# Patient Record
Sex: Female | Born: 1935 | Race: White | Hispanic: No | Smoking: Never smoker
Health system: Southern US, Community
[De-identification: ages and names within clinical notes are randomized; demographics above are authoritative.]

## PROBLEM LIST (undated history)

## (undated) DIAGNOSIS — E7800 Pure hypercholesterolemia, unspecified: Secondary | ICD-10-CM

## (undated) DIAGNOSIS — G4700 Insomnia, unspecified: Secondary | ICD-10-CM

## (undated) DIAGNOSIS — I10 Essential (primary) hypertension: Secondary | ICD-10-CM

## (undated) DIAGNOSIS — D0510 Intraductal carcinoma in situ of unspecified breast: Secondary | ICD-10-CM

## (undated) DIAGNOSIS — C4492 Squamous cell carcinoma of skin, unspecified: Secondary | ICD-10-CM

## (undated) HISTORY — PX: EYE SURGERY: SHX253

## (undated) HISTORY — DX: Hypercalcemia: E83.52

## (undated) HISTORY — PX: ABDOMINAL HYSTERECTOMY: SHX81

## (undated) HISTORY — DX: Squamous cell carcinoma of skin, unspecified: C44.92

---

## 2000-10-18 ENCOUNTER — Encounter (INDEPENDENT_AMBULATORY_CARE_PROVIDER_SITE_OTHER): Payer: Self-pay | Admitting: Specialist

## 2000-10-18 ENCOUNTER — Ambulatory Visit (HOSPITAL_COMMUNITY): Admission: RE | Admit: 2000-10-18 | Discharge: 2000-10-18 | Payer: Self-pay | Admitting: Gastroenterology

## 2005-10-07 ENCOUNTER — Ambulatory Visit: Payer: Self-pay | Admitting: Family Medicine

## 2006-01-29 ENCOUNTER — Ambulatory Visit: Payer: Self-pay | Admitting: Family Medicine

## 2006-02-08 ENCOUNTER — Ambulatory Visit (HOSPITAL_COMMUNITY): Admission: RE | Admit: 2006-02-08 | Discharge: 2006-02-08 | Payer: Self-pay | Admitting: Family Medicine

## 2006-02-09 ENCOUNTER — Encounter: Admission: RE | Admit: 2006-02-09 | Discharge: 2006-02-09 | Payer: Self-pay | Admitting: Sports Medicine

## 2006-02-12 ENCOUNTER — Ambulatory Visit: Payer: Self-pay | Admitting: Family Medicine

## 2006-02-15 ENCOUNTER — Other Ambulatory Visit: Admission: RE | Admit: 2006-02-15 | Discharge: 2006-02-15 | Payer: Self-pay | Admitting: Interventional Radiology

## 2006-02-15 ENCOUNTER — Encounter: Admission: RE | Admit: 2006-02-15 | Discharge: 2006-02-15 | Payer: Self-pay | Admitting: Family Medicine

## 2006-02-15 ENCOUNTER — Encounter (INDEPENDENT_AMBULATORY_CARE_PROVIDER_SITE_OTHER): Payer: Self-pay | Admitting: Specialist

## 2006-06-17 DIAGNOSIS — I10 Essential (primary) hypertension: Secondary | ICD-10-CM | POA: Insufficient documentation

## 2006-06-17 DIAGNOSIS — E785 Hyperlipidemia, unspecified: Secondary | ICD-10-CM | POA: Insufficient documentation

## 2006-06-17 DIAGNOSIS — G4700 Insomnia, unspecified: Secondary | ICD-10-CM | POA: Insufficient documentation

## 2006-09-01 ENCOUNTER — Encounter: Admission: RE | Admit: 2006-09-01 | Discharge: 2006-09-01 | Payer: Self-pay | Admitting: Sports Medicine

## 2007-03-16 ENCOUNTER — Encounter: Admission: RE | Admit: 2007-03-16 | Discharge: 2007-03-16 | Payer: Self-pay | Admitting: Sports Medicine

## 2007-09-27 ENCOUNTER — Encounter: Admission: RE | Admit: 2007-09-27 | Discharge: 2007-09-27 | Payer: Self-pay | Admitting: Sports Medicine

## 2007-09-27 ENCOUNTER — Encounter: Payer: Self-pay | Admitting: Family Medicine

## 2008-11-26 ENCOUNTER — Encounter: Admission: RE | Admit: 2008-11-26 | Discharge: 2008-11-26 | Payer: Self-pay | Admitting: *Deleted

## 2010-01-27 ENCOUNTER — Encounter: Admission: RE | Admit: 2010-01-27 | Discharge: 2010-01-27 | Payer: Self-pay | Admitting: *Deleted

## 2010-09-05 NOTE — Procedures (Signed)
[place]  Patient:    Patricia Mclaughlin, Patricia Mclaughlin                         MRN: [id number] Proc. Date: 10/18/00 Adm. Date:  [id number] Attending:  Charna Elizabeth CC:         Huey Bienenstock McDiarmid, M.D.   Procedure Report  DATE OF BIRTH:  May 20, 1925  REFERRING PHYSICIAN:  Huey Bienenstock McDiarmid, M.D.  PROCEDURE PERFORMED:  Colonoscopy with snare polypectomy x 2 and cold biopsy x 1.  ENDOSCOPIST:  Anselmo Rod, M.D.  INSTRUMENT USED:  Olympus video colonoscope (pediatric).  INDICATIONS FOR PROCEDURE:  Screening colonoscopy being performed in a 77 year old white female who is otherwise healthy, rule out colonic polyps, masses, hemorrhoids.  PREPROCEDURE PREPARATION:  Informed consent was procured from the patient. The patient was fasted for eight hours prior to the procedure and prepped with a bottle of magnesium citrate and a gallon of NuLytely the night prior to the procedure.  PREPROCEDURE PHYSICAL:  The patient had stable vital signs.  Neck supple. Chest clear to auscultation.  S1, S2 regular.  Abdomen soft with normal abdominal bowel sounds.  DESCRIPTION OF PROCEDURE:  The patient was placed in the left lateral decubitus position and sedated with 50 mg of Demerol and 7.5 mg of Versed intravenously.  Once the patient was adequately sedated and maintained on low-flow oxygen and continuous cardiac monitoring, the Olympus video colonoscope was advanced from the rectum to the cecum without difficulty.  The patient had an excellent prep.  One small polyp was biopsied from the right colon with a core biopsy forceps.  Two smaller polyps were removed by snare polypectomy.  These were very small sessile polyps.  There was no evidence of diverticulosis, erosions, ulcerations, masses or hemorrhoids.  IMPRESSION:  Three small polyps removed from the right colon, otherwise healthy-appearing cecum, transverse colon, left colon, rectosigmoid  and rectum.  RECOMMENDATIONS: 1. Await pathology results. 2. Outpatient follow-up on a p.r.n. basis. 3. Repeat colorectal cancer screening depending on the pathology results.DD: 10/18/00 TD:  10/19/00 Job: [id number]

## 2010-12-31 ENCOUNTER — Other Ambulatory Visit: Payer: Self-pay | Admitting: *Deleted

## 2010-12-31 DIAGNOSIS — Z1231 Encounter for screening mammogram for malignant neoplasm of breast: Secondary | ICD-10-CM

## 2011-02-03 ENCOUNTER — Ambulatory Visit
Admission: RE | Admit: 2011-02-03 | Discharge: 2011-02-03 | Disposition: A | Discharge status: Home / Self Care | Payer: Medicare Other | Source: Ambulatory Visit | Attending: *Deleted | Admitting: *Deleted

## 2011-02-03 DIAGNOSIS — Z1231 Encounter for screening mammogram for malignant neoplasm of breast: Secondary | ICD-10-CM

## 2012-02-08 ENCOUNTER — Other Ambulatory Visit: Payer: Self-pay | Admitting: Oncology

## 2012-02-08 DIAGNOSIS — Z1231 Encounter for screening mammogram for malignant neoplasm of breast: Secondary | ICD-10-CM

## 2019-08-30 ENCOUNTER — Other Ambulatory Visit: Payer: Self-pay | Admitting: Neuromusculoskeletal Medicine & OMM

## 2019-08-30 DIAGNOSIS — Z1231 Encounter for screening mammogram for malignant neoplasm of breast: Secondary | ICD-10-CM

## 2019-09-04 ENCOUNTER — Other Ambulatory Visit: Payer: Self-pay | Admitting: Neuromusculoskeletal Medicine & OMM

## 2019-09-04 DIAGNOSIS — R921 Mammographic calcification found on diagnostic imaging of breast: Secondary | ICD-10-CM

## 2019-09-12 ENCOUNTER — Other Ambulatory Visit: Payer: Self-pay | Admitting: Neuromusculoskeletal Medicine & OMM

## 2019-09-12 ENCOUNTER — Other Ambulatory Visit: Payer: Self-pay

## 2019-09-12 ENCOUNTER — Ambulatory Visit
Admission: RE | Admit: 2019-09-12 | Discharge: 2019-09-12 | Disposition: A | Discharge status: Home / Self Care | Payer: Medicare Other | Source: Ambulatory Visit | Attending: Neuromusculoskeletal Medicine & OMM | Admitting: Neuromusculoskeletal Medicine & OMM

## 2019-09-12 DIAGNOSIS — R921 Mammographic calcification found on diagnostic imaging of breast: Secondary | ICD-10-CM

## 2019-09-12 MED FILL — AMOXICILLIN 500 MG CAPSULE: 500 | 7 days supply | Qty: 21 | Fill #0

## 2019-12-19 ENCOUNTER — Ambulatory Visit: Payer: Self-pay

## 2020-01-16 ENCOUNTER — Ambulatory Visit: Payer: Medicare Other | Attending: Internal Medicine

## 2020-01-16 DIAGNOSIS — Z23 Encounter for immunization: Secondary | ICD-10-CM

## 2020-01-16 NOTE — Progress Notes (Signed)
   Covid-19 Vaccination Clinic  Name:  Patricia Mclaughlin    MRN: [id number] DOB: [birth year]  01/16/2020  Patricia Mclaughlin was observed post Covid-19 immunization for 15 minutes without incident. She was provided with Vaccine Information Sheet and instruction to access the V-Safe system.   Patricia Mclaughlin was instructed to call 911 with any severe reactions post vaccine: Marland Kitchen Difficulty breathing  . Swelling of face and throat  . A fast heartbeat  . A bad rash all over body  . Dizziness and weakness

## 2020-03-18 ENCOUNTER — Ambulatory Visit
Admission: RE | Admit: 2020-03-18 | Discharge: 2020-03-18 | Disposition: A | Discharge status: Home / Self Care | Payer: Medicare Other | Source: Ambulatory Visit | Attending: Neuromusculoskeletal Medicine & OMM | Admitting: Neuromusculoskeletal Medicine & OMM

## 2020-03-18 ENCOUNTER — Other Ambulatory Visit: Payer: Self-pay | Admitting: Neuromusculoskeletal Medicine & OMM

## 2020-03-18 ENCOUNTER — Other Ambulatory Visit: Payer: Self-pay

## 2020-03-18 DIAGNOSIS — R921 Mammographic calcification found on diagnostic imaging of breast: Secondary | ICD-10-CM

## 2020-09-13 ENCOUNTER — Ambulatory Visit
Admission: RE | Admit: 2020-09-13 | Discharge: 2020-09-13 | Disposition: A | Discharge status: Home / Self Care | Payer: Medicare Other | Source: Ambulatory Visit | Attending: Neuromusculoskeletal Medicine & OMM | Admitting: Neuromusculoskeletal Medicine & OMM

## 2020-09-13 ENCOUNTER — Other Ambulatory Visit: Payer: Self-pay

## 2020-09-13 DIAGNOSIS — R921 Mammographic calcification found on diagnostic imaging of breast: Secondary | ICD-10-CM

## 2021-08-14 ENCOUNTER — Other Ambulatory Visit: Payer: Self-pay | Admitting: Internal Medicine

## 2021-08-14 DIAGNOSIS — R921 Mammographic calcification found on diagnostic imaging of breast: Secondary | ICD-10-CM

## 2021-09-21 ENCOUNTER — Ambulatory Visit (HOSPITAL_COMMUNITY)
Admission: RE | Admit: 2021-09-21 | Discharge: 2021-09-21 | Disposition: A | Discharge status: Home / Self Care | Payer: Medicare Other | Source: Ambulatory Visit

## 2021-09-21 ENCOUNTER — Ambulatory Visit (INDEPENDENT_AMBULATORY_CARE_PROVIDER_SITE_OTHER): Payer: Medicare Other

## 2021-09-21 ENCOUNTER — Encounter (HOSPITAL_COMMUNITY): Payer: Self-pay

## 2021-09-21 VITALS — BP 182/74 | HR 57 | Temp 97.9°F | Resp 20

## 2021-09-21 DIAGNOSIS — R059 Cough, unspecified: Secondary | ICD-10-CM | POA: Diagnosis not present

## 2021-09-21 DIAGNOSIS — J208 Acute bronchitis due to other specified organisms: Secondary | ICD-10-CM

## 2021-09-21 HISTORY — DX: Essential (primary) hypertension: I10

## 2021-09-21 HISTORY — DX: Pure hypercholesterolemia, unspecified: E78.00

## 2021-09-21 MED ORDER — PREDNISONE 20 MG PO TABS: 40.0000 mg | ORAL_TABLET | Freq: Every day | ORAL | 0 refills | Status: AC

## 2021-09-21 NOTE — ED Provider Notes (Signed)
[place]    CSN: [id number] Arrival date & time: 09/21/21  1542      History   Chief Complaint Chief Complaint  Patient presents with   Cough    Entered by patient    HPI Patricia Mclaughlin is a 88 y.o. female presenting with cough for approximately 10 days.  History noncontributory, denies history of pulmonary disease, never smoker.  She describes nonproductive cough for about 10 days.  This is not associated with dyspnea.  Denies sore throat, nasal congestion, facial pressure, ear pain, chest pain, new back or flank pain.  Has attempted Robitussin, Tessalon without relief.  Negative home COVID test.  HPI  Past Medical History:  Diagnosis Date   High cholesterol    Hypertension     Patient Active Problem List   Diagnosis Date Noted   HYPERLIPIDEMIA 06/17/2006   HYPERTENSION, BENIGN SYSTEMIC 06/17/2006   INSOMNIA NOS 06/17/2006    Past Surgical History:  Procedure Laterality Date   ABDOMINAL HYSTERECTOMY     EYE SURGERY      OB History   No obstetric history on file.      Home Medications    Prior to Admission medications   Medication Sig Start Date End Date Taking? Authorizing Provider  aspirin EC 81 MG tablet Take 81 mg by mouth daily. Swallow whole.   Yes [provider]  benzonatate (TESSALON PERLES) 100 MG capsule Take 100 mg by mouth in the morning, at noon, and at bedtime. 03/28/21  Yes [provider]  predniSONE (DELTASONE) 20 MG tablet Take 2 tablets (40 mg total) by mouth daily for 5 days. Take with breakfast or lunch. Avoid NSAIDs (ibuprofen, etc) while taking this medication. 09/21/21 09/26/21 Yes Hazel Sams, PA-C  amLODipine (NORVASC) 2.5 MG tablet Take 2.5 mg by mouth daily. 08/27/21   [provider]  atorvastatin (LIPITOR) 80 MG tablet Take 40 mg by mouth daily. 08/22/21   [provider]  bisoprolol (ZEBETA) 10 MG tablet Take 1 tablet by mouth 2 (two) times daily.    [provider]   Cholecalciferol 50 MCG (2000 UT) TABS Take 2,000 mg by mouth 1 day or 1 dose.    [provider]  Cyanocobalamin (VITAMIN B12) 1000 MCG TBCR Take 100 mcg by mouth daily.    [provider]  hydrochlorothiazide (HYDRODIURIL) 12.5 MG tablet Take 25 mg by mouth daily. 05/13/21   [provider]  magnesium oxide (MAG-OX) 400 MG tablet Take 1 tablet by mouth daily. 07/29/21   [provider]  potassium chloride (KLOR-CON) 8 MEQ tablet Take 8 mEq by mouth in the morning and at bedtime. 07/09/21   [provider]  zolpidem (AMBIEN) 5 MG tablet Take 2.5 mg by mouth at bedtime. 05/27/21   [provider]    Family History No family history on file.  Social History     Allergies   Ace inhibitors   Review of Systems Review of Systems  Constitutional:  Negative for appetite change, chills and fever.  HENT:  Negative for congestion, ear pain, rhinorrhea, sinus pressure, sinus pain and sore throat.   Eyes:  Negative for redness and visual disturbance.  Respiratory:  Positive for cough. Negative for chest tightness, shortness of breath and wheezing.   Cardiovascular:  Negative for chest pain and palpitations.  Gastrointestinal:  Negative for abdominal pain, constipation, diarrhea, nausea and vomiting.  Genitourinary:  Negative for dysuria, frequency and urgency.  Musculoskeletal:  Negative for myalgias.  Neurological:  Negative for dizziness, weakness and headaches.  Psychiatric/Behavioral:  Negative for confusion.   All other systems reviewed and are negative.   Physical Exam Triage Vital Signs ED Triage Vitals [09/21/21 1620]  Enc Vitals Group     BP (!) 182/74     Pulse Rate (!) 57     Resp 20     Temp 97.9 F (36.6 C)     Temp Source Oral     SpO2 94 %     Weight      Height      Head Circumference      Peak Flow      Pain Score 0     Pain Loc      Pain Edu?      Excl. in [place]?    No data found.  Updated Vital Signs BP (!)  182/74 (BP Location: Left Arm)   Pulse (!) 57   Temp 97.9 F (36.6 C) (Oral)   Resp 20   SpO2 94%   Visual Acuity Right Eye Distance:   Left Eye Distance:   Bilateral Distance:    Right Eye Near:   Left Eye Near:    Bilateral Near:     Physical Exam Vitals reviewed.  Constitutional:      General: She is not in acute distress.    Appearance: Normal appearance. She is not ill-appearing.  HENT:     Head: Normocephalic and atraumatic.     Right Ear: Tympanic membrane, ear canal and external ear normal. No tenderness. No middle ear effusion. There is no impacted cerumen. Tympanic membrane is not perforated, erythematous, retracted or bulging.     Left Ear: Tympanic membrane, ear canal and external ear normal. No tenderness.  No middle ear effusion. There is no impacted cerumen. Tympanic membrane is not perforated, erythematous, retracted or bulging.     Nose: Nose normal. No congestion.     Mouth/Throat:     Mouth: Mucous membranes are moist.     Pharynx: Uvula midline. No oropharyngeal exudate or posterior oropharyngeal erythema.  Eyes:     Extraocular Movements: Extraocular movements intact.     Pupils: Pupils are equal, round, and reactive to light.  Cardiovascular:     Rate and Rhythm: Normal rate and regular rhythm.     Heart sounds: Normal heart sounds.  Pulmonary:     Effort: Pulmonary effort is normal.     Breath sounds: Normal breath sounds. No decreased breath sounds, wheezing, rhonchi or rales.     Comments: Freq cough. Oxygenating comfortably.  Abdominal:     Palpations: Abdomen is soft.     Tenderness: There is no abdominal tenderness. There is no guarding or rebound.  Lymphadenopathy:     Cervical: No cervical adenopathy.     Right cervical: No superficial cervical adenopathy.    Left cervical: No superficial cervical adenopathy.  Neurological:     General: No focal deficit present.     Mental Status: She is alert and oriented to person, place, and time.   Psychiatric:        Mood and Affect: Mood normal.        Behavior: Behavior normal.        Thought Content: Thought content normal.        Judgment: Judgment normal.     UC Treatments / Results  Labs (all labs ordered are listed, but only abnormal results are displayed) Labs Reviewed - No data to display  EKG  Radiology DG Chest 2 View  Result Date: 09/21/2021 CLINICAL DATA:  Cough for >1 week EXAM: CHEST - 2 VIEW COMPARISON:  None Available. FINDINGS: The cardiomediastinal silhouette is normal in contour. Tortuous thoracic aorta. No pleural effusion. No pneumothorax. No acute pleuroparenchymal abnormality. Visualized abdomen is unremarkable. No acute osseous abnormality noted. IMPRESSION: No acute cardiopulmonary abnormality. Electronically Signed   By: Valentino Saxon M.D.   On: 09/21/2021 16:53    Procedures Procedures (including critical care time)  Medications Ordered in UC Medications - No data to display  Initial Impression / Assessment and Plan / UC Course  I have reviewed the triage vital signs and the nursing notes.  Pertinent labs & imaging results that were available during my care of the patient were reviewed by me and considered in my medical decision making (see chart for details).     This patient is a very pleasant 88 y.o. year old female presenting with viral bronchitis following viral syndrome. Today this pt is afebrile nontachycardic nontachypneic, oxygenating well on room air, no wheezes rhonchi or rales. Cough is nonproductive. No history pulm ds, never smoker.  CXR - No acute cardiopulmonary abnormality.  Low-dose prednisone sent. She is not a diabetic. Continue tessalon, robitussen.   ED return precautions discussed. Patient and daughters verbalize understanding and agreement.    Final Clinical Impressions(s) / UC Diagnoses   Final diagnoses:  Viral bronchitis     Discharge Instructions      -You have bronchitis. Bronchitis is an  inflammation of the lining of your bronchial tubes, which carry air to and from your lungs. This typically occurs after a virus, like a cold virus. People who have bronchitis often cough up thickened mucus, which can be discolored. This isn't a bacterial infection, so you don't need antibiotics. We treat it with medications to help reduce inflammation and open up your lungs.  -Prednisone, 2 pills taken at the same time for 5 days in a row.  Try taking this earlier in the day as it can give you energy. -Follow-up if symptoms worsen/persist      ED Prescriptions     Medication Sig Dispense Auth. Provider   predniSONE (DELTASONE) 20 MG tablet Take 2 tablets (40 mg total) by mouth daily for 5 days. Take with breakfast or lunch. Avoid NSAIDs (ibuprofen, etc) while taking this medication. 10 tablet Hazel Sams, PA-C      PDMP not reviewed this encounter.   Hazel Sams, PA-C 09/21/21 1725

## 2021-09-21 NOTE — ED Triage Notes (Signed)
Pt had cough for over week that is dry. Robitussin, allergy medications and Tessalon aren't helping.  Pt had home covid test today that was negative

## 2021-09-21 NOTE — Discharge Instructions (Addendum)
-  You have bronchitis. Bronchitis is an inflammation of the lining of your bronchial tubes, which carry air to and from your lungs. This typically occurs after a virus, like a cold virus. People who have bronchitis often cough up thickened mucus, which can be discolored. This isn't a bacterial infection, so you don't need antibiotics. We treat it with medications to help reduce inflammation and open up your lungs.  -Prednisone, 2 pills taken at the same time for 5 days in a row.  Try taking this earlier in the day as it can give you energy. -Follow-up if symptoms worsen/persist

## 2021-09-23 ENCOUNTER — Other Ambulatory Visit: Payer: Self-pay | Admitting: Internal Medicine

## 2021-09-23 ENCOUNTER — Other Ambulatory Visit: Payer: Self-pay | Admitting: Neuromusculoskeletal Medicine & OMM

## 2021-09-23 DIAGNOSIS — R921 Mammographic calcification found on diagnostic imaging of breast: Secondary | ICD-10-CM

## 2021-09-24 ENCOUNTER — Other Ambulatory Visit: Payer: Self-pay | Admitting: Internal Medicine

## 2021-09-24 ENCOUNTER — Other Ambulatory Visit (HOSPITAL_COMMUNITY): Payer: Self-pay

## 2021-09-24 ENCOUNTER — Ambulatory Visit
Admission: RE | Admit: 2021-09-24 | Discharge: 2021-09-24 | Disposition: A | Discharge status: Home / Self Care | Payer: Medicare Other | Source: Ambulatory Visit | Attending: Internal Medicine | Admitting: Internal Medicine

## 2021-09-24 DIAGNOSIS — R921 Mammographic calcification found on diagnostic imaging of breast: Secondary | ICD-10-CM

## 2021-09-24 MED ORDER — ATORVASTATIN CALCIUM 80 MG PO TABS
40.0000 mg | ORAL_TABLET | Freq: Every day | ORAL | 4 refills | Status: DC
  Filled 2021-09-24 – 2021-09-25 (×2): qty 45, 90d supply, fill #0

## 2021-09-24 MED ORDER — AMLODIPINE BESYLATE 2.5 MG PO TABS
2.5000 mg | ORAL_TABLET | Freq: Every day | ORAL | 4 refills | Status: DC
  Filled 2021-09-24 – 2021-09-25 (×2): qty 90, 90d supply, fill #0

## 2021-09-24 MED ORDER — POTASSIUM CHLORIDE ER 8 MEQ PO TBCR
8.0000 meq | EXTENDED_RELEASE_TABLET | Freq: Every day | ORAL | 0 refills | Status: DC
  Filled 2021-09-24: qty 180, 180d supply, fill #0
  Filled 2021-09-25: qty 14, 7d supply, fill #0

## 2021-09-25 ENCOUNTER — Other Ambulatory Visit (HOSPITAL_COMMUNITY): Payer: Self-pay

## 2021-09-27 ENCOUNTER — Ambulatory Visit (HOSPITAL_COMMUNITY)
Admission: RE | Admit: 2021-09-27 | Discharge: 2021-09-27 | Disposition: A | Discharge status: Home / Self Care | Payer: Medicare Other | Source: Ambulatory Visit | Attending: Emergency Medicine | Admitting: Emergency Medicine

## 2021-09-27 ENCOUNTER — Encounter (HOSPITAL_COMMUNITY): Payer: Self-pay

## 2021-09-27 VITALS — BP 142/55 | HR 54 | Temp 97.9°F | Resp 16

## 2021-09-27 DIAGNOSIS — R051 Acute cough: Secondary | ICD-10-CM | POA: Diagnosis not present

## 2021-09-27 MED ORDER — MONTELUKAST SODIUM 10 MG PO TABS: 10.0000 mg | ORAL_TABLET | Freq: Every day | ORAL | 0 refills | Status: DC

## 2021-09-27 MED ORDER — AZITHROMYCIN 250 MG PO TABS: 250.0000 mg | ORAL_TABLET | Freq: Every day | ORAL | 0 refills | Status: DC

## 2021-09-27 NOTE — Discharge Instructions (Signed)
The exact cause of your cough is unknown as it is not accompanied by infectious symptoms, related to heartburn or indigestion and no abnormalities was noted on your recent checks x-ray however we will move forward with treatment and attempts to reduce it  Begin use of azithromycin as directed on packaging, we will cover for bacteria to ensure this is not a contributing factor  Begin use of Singulair daily before bedtime, this medicine helps to alleviate symptoms related to asthma and allergies  Continue your daily Allegra, may continue use of Tessalon and Robitussin-DM as needed as these are cough suppressant  May use humidifier at bedtime to keep the airways moistened throughout the night  Maintaining adequate hydration may help to thin secretions and soothe the respiratory mucosa   Warm Liquids- Ingestion of warm liquids may have a soothing effect on the respiratory mucosa, increase the flow of nasal mucus, and loosen respiratory secretions, making them easier to remove  May try honey (2.5 to 5 mL [0.5 to 1 teaspoon]) can be given straight or diluted in liquid (juice). Corn syrup may be substituted if honey is not available.    Your 1 to 2 weeks of persistent medication use and no improvement seen you may stop the Singulair and attempt use of famotidine (Prilosec) 20 mg daily which can be found over-the-counter, symptoms continue to persist she may follow-up with urgent care or primary doctor for reevaluation

## 2021-09-27 NOTE — ED Triage Notes (Signed)
Pt states dry cough for the past 2 weeks. Seen here last week for same.  Pt finished prednisone with no relief.

## 2021-09-27 NOTE — ED Provider Notes (Signed)
[place]    CSN: [id number] Arrival date & time: 09/27/21  1308      History   Chief Complaint Chief Complaint  Patient presents with   Cough    Day 15 of unproductive cough Not sleeping Not able to stop coughing - Entered by patient    HPI Patricia Mclaughlin is a 88 y.o. female.   Patient presents with a nonproductive cough for 15 days.  Symptoms started abruptly without accompanying URI. Had 1 episode of cough being productive.  Associated wheezing initially but resolved after 2 to 3 days.  Cough is not interfering with sleep and has caused episodes of vomiting along with bursting of the blood vessels in his eyes.  Has attempted use of Tessalon, Robitussin-DM.  Prednisone burst which have been ineffective.  Was evaluated in urgent care within the last 7 days, chest x-ray negative.  Denies shortness of breath, chest pain or tightness.  History of seasonal allergies, taking Allegra daily.  Denies other respiratory history.  Smoker, stopped at age 37.     Past Medical History:  Diagnosis Date   High cholesterol    Hypertension     Patient Active Problem List   Diagnosis Date Noted   HYPERLIPIDEMIA 06/17/2006   HYPERTENSION, BENIGN SYSTEMIC 06/17/2006   INSOMNIA NOS 06/17/2006    Past Surgical History:  Procedure Laterality Date   ABDOMINAL HYSTERECTOMY     EYE SURGERY      OB History   No obstetric history on file.      Home Medications    Prior to Admission medications   Medication Sig Start Date End Date Taking? Authorizing Provider  amLODipine (NORVASC) 2.5 MG tablet Take 2.5 mg by mouth daily. 08/27/21   [provider]  amLODipine (NORVASC) 2.5 MG tablet Take 1 tablet (2.5 mg total) by mouth daily. 08/27/21     aspirin EC 81 MG tablet Take 81 mg by mouth daily. Swallow whole.    [provider]  atorvastatin (LIPITOR) 80 MG tablet Take 40 mg by mouth daily. 08/22/21   [provider]  atorvastatin (LIPITOR) 80 MG  tablet Take 1/2 tablet (40 mg total) by mouth daily. 08/22/21     benzonatate (TESSALON PERLES) 100 MG capsule Take 100 mg by mouth in the morning, at noon, and at bedtime. 03/28/21   [provider]  bisoprolol (ZEBETA) 10 MG tablet Take 1 tablet by mouth 2 (two) times daily.    [provider]  Cholecalciferol 50 MCG (2000 UT) TABS Take 2,000 mg by mouth 1 day or 1 dose.    [provider]  Cyanocobalamin (VITAMIN B12) 1000 MCG TBCR Take 100 mcg by mouth daily.    [provider]  hydrochlorothiazide (HYDRODIURIL) 12.5 MG tablet Take 25 mg by mouth daily. 05/13/21   [provider]  magnesium oxide (MAG-OX) 400 MG tablet Take 1 tablet by mouth daily. 07/29/21   [provider]  potassium chloride (KLOR-CON) 8 MEQ tablet Take 8 mEq by mouth in the morning and at bedtime. 07/09/21   [provider]  potassium chloride (KLOR-CON) 8 MEQ tablet Take 2 tablets (16 mEq total) by mouth daily with food 07/09/21     zolpidem (AMBIEN) 5 MG tablet Take 2.5 mg by mouth at bedtime. 05/27/21   [provider]    Family History History reviewed. No pertinent family history.  Social History Social History   Tobacco Use   Smoking status: Never   Smokeless tobacco:  Never  Vaping Use   Vaping Use: Never used     Allergies   Ace inhibitors   Review of Systems Review of Systems  Constitutional: Negative.   HENT: Negative.    Respiratory:  Positive for cough. Negative for apnea, choking, chest tightness, shortness of breath, wheezing and stridor.   Cardiovascular: Negative.   Skin: Negative.      Physical Exam Triage Vital Signs ED Triage Vitals  Enc Vitals Group     BP 09/27/21 1330 (!) 142/55     Pulse Rate 09/27/21 1330 (!) 54     Resp 09/27/21 1330 16     Temp 09/27/21 1330 97.9 F (36.6 C)     Temp Source 09/27/21 1330 Oral     SpO2 09/27/21 1330 97 %     Weight --      Height --      Head Circumference --      Peak  Flow --      Pain Score 09/27/21 1333 0     Pain Loc --      Pain Edu? --      Excl. in [place]? --    No data found.  Updated Vital Signs BP (!) 142/55 (BP Location: Left Arm)   Pulse (!) 54   Temp 97.9 F (36.6 C) (Oral)   Resp 16   SpO2 97%   Visual Acuity Right Eye Distance:   Left Eye Distance:   Bilateral Distance:    Right Eye Near:   Left Eye Near:    Bilateral Near:     Physical Exam Constitutional:      Appearance: Normal appearance.  Eyes:     Extraocular Movements: Extraocular movements intact.  Cardiovascular:     Rate and Rhythm: Normal rate and regular rhythm.     Pulses: Normal pulses.     Heart sounds: Normal heart sounds.  Pulmonary:     Effort: Pulmonary effort is normal.     Breath sounds: Normal breath sounds.  Neurological:     Mental Status: She is alert and oriented to person, place, and time. Mental status is at baseline.  Psychiatric:        Mood and Affect: Mood normal.        Behavior: Behavior normal.      UC Treatments / Results  Labs (all labs ordered are listed, but only abnormal results are displayed) Labs Reviewed - No data to display  EKG   Radiology No results found.  Procedures Procedures (including critical care time)  Medications Ordered in UC Medications - No data to display  Initial Impression / Assessment and Plan / UC Course  I have reviewed the triage vital signs and the nursing notes.  Pertinent labs & imaging results that were available during my care of the patient were reviewed by me and considered in my medical decision making (see chart for details).  Acute cough  Unknown etiology, vital signs are stable with O2 saturation 97% and lungs are clear to auscultation, without accompanying symptoms presentation does not appear to be infectious however as cough has been present for 15 days will begin bacterial coverage, Z-Pak prescribed, patient has been unsuccessful with Tessalon, Robitussin-DM and a  prednisone course, advised to continue use of Allegra, will try Singulair for 14 days, if ineffective patient may attempt use of over-the-counter famotidine 20 mg daily, patient may attempt use of humidifier at night and additional over-the-counter medications as needed, if ineffective patient did not follow-up  with urgent care or primary doctor for reevaluation and further management of symptoms Final Clinical Impressions(s) / UC Diagnoses   Final diagnoses:  None   Discharge Instructions   None    ED Prescriptions   None    PDMP not reviewed this encounter.   Hans Eden, NP 09/27/21 1527

## 2021-09-29 ENCOUNTER — Ambulatory Visit
Admission: RE | Admit: 2021-09-29 | Discharge: 2021-09-29 | Disposition: A | Discharge status: Home / Self Care | Payer: Medicare Other | Source: Ambulatory Visit | Attending: Internal Medicine | Admitting: Internal Medicine

## 2021-09-29 DIAGNOSIS — R921 Mammographic calcification found on diagnostic imaging of breast: Secondary | ICD-10-CM

## 2021-10-13 ENCOUNTER — Ambulatory Visit: Payer: Self-pay | Admitting: General Surgery

## 2021-10-13 DIAGNOSIS — D0511 Intraductal carcinoma in situ of right breast: Secondary | ICD-10-CM

## 2021-10-14 ENCOUNTER — Telehealth: Payer: Self-pay | Admitting: Hematology and Oncology

## 2021-10-15 ENCOUNTER — Other Ambulatory Visit: Payer: Self-pay | Admitting: General Surgery

## 2021-10-15 DIAGNOSIS — D0511 Intraductal carcinoma in situ of right breast: Secondary | ICD-10-CM

## 2021-10-15 NOTE — Progress Notes (Signed)
New Breast Cancer Diagnosis: Right Breast LOQ  Did patient present with symptoms (if so, please note symptoms) or screening mammography?:Screening Calcifications    Location and Extent of disease :right breast. Located in the lower outer quadrant, measured  1.7 cm in greatest dimension. Adenopathy no.   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 Marlou Starks 10/13/2021 -I have discussed with her in detail the different options for treatment and at this point she favors breast conservation which I feel is very reasonable. -I will refer her to medical and radiation oncology to discuss adjuvant therapy. -Right Breast Radioactive seed localized Lumpectomy 11/17/2021   Medical oncologist, treatment if any:   Dr. Lindi Adie 10/28/2021   Family History of Breast/Ovarian/Prostate Cancer: Mother had breast cancer.    Lymphedema issues, if any: None     Pain issues, if any: No     SAFETY ISSUES: Prior radiation? No Pacemaker/ICD? No Possible current pregnancy? Hysterectomy Is the patient on methotrexate? No  Current Complaints / other details:

## 2021-10-16 ENCOUNTER — Encounter: Payer: Self-pay | Admitting: Radiation Oncology

## 2021-10-16 ENCOUNTER — Ambulatory Visit
Admission: RE | Admit: 2021-10-16 | Discharge: 2021-10-16 | Disposition: A | Discharge status: Home / Self Care | Payer: Medicare Other | Source: Ambulatory Visit | Attending: Radiation Oncology | Admitting: Radiation Oncology

## 2021-10-16 DIAGNOSIS — D0511 Intraductal carcinoma in situ of right breast: Secondary | ICD-10-CM

## 2021-10-16 DIAGNOSIS — C50511 Malignant neoplasm of lower-outer quadrant of right female breast: Secondary | ICD-10-CM | POA: Insufficient documentation

## 2021-10-16 NOTE — Progress Notes (Signed)
Radiation Oncology         [phone]) [phone] ________________________________  Name: Patricia Mclaughlin        MRN: [id number]  Date of Service: 10/16/2021 DOB: 06/11/1925  [id number], Desiree Lucy, MD  Jovita Kussmaul, MD     REFERRING PHYSICIAN: Jovita Kussmaul, MD   DIAGNOSIS: The encounter diagnosis was Ductal carcinoma in situ (DCIS) of right breast.   HISTORY OF PRESENT ILLNESS: Patricia Mclaughlin is a 88 y.o. female seen at the request of Dr. Marlou Starks for a new diagnosis right breast cancer.  The patient has been followed with diagnostic mammography for calcifications in the right breast, calcifications spanned 1.7 cm and these were felt to be indeterminate so she did undergo stereotactic biopsy on 09/29/2021 which showed high-grade ER/PR positive DCIS focally suspicious for microinvasion, microcalcifications were noted.  No necrosis was seen.  She is scheduled to undergo right breast lumpectomy on 11/17/2021 and is seen to discuss additional adjuvant therapies.    PREVIOUS RADIATION THERAPY: No   PAST MEDICAL HISTORY:  Past Medical History:  Diagnosis Date   High cholesterol    Hypertension        PAST SURGICAL HISTORY: Past Surgical History:  Procedure Laterality Date   ABDOMINAL HYSTERECTOMY     EYE SURGERY       FAMILY HISTORY: No family history on file.   SOCIAL HISTORY:  reports that she has never smoked. She has never used smokeless tobacco. 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 She's accompanied by her son Inocente Salles on our call today. She comes to [facility] for her mammography and her two daughters live in town. She is active in exercise, and enjoys yoga and enjoys participating in local community engagement including meals on wheels and a food pantry.   ALLERGIES: Ace inhibitors   MEDICATIONS:  Current Outpatient Medications  Medication Sig Dispense Refill   amLODipine (NORVASC) 2.5 MG tablet Take 1 tablet (2.5 mg total) by  mouth daily. 90 tablet 4   aspirin EC 81 MG tablet Take 81 mg by mouth daily. Swallow whole.     atorvastatin (LIPITOR) 80 MG tablet Take 1/2 tablet (40 mg total) by mouth daily. 45 tablet 4   azithromycin (ZITHROMAX) 250 MG tablet Take 1 tablet (250 mg total) by mouth daily. Take first 2 tablets together, then 1 every day until finished. 6 tablet 0   benzonatate (TESSALON PERLES) 100 MG capsule Take 100 mg by mouth in the morning, at noon, and at bedtime.     bisoprolol (ZEBETA) 10 MG tablet Take 1 tablet by mouth 2 (two) times daily.     Cholecalciferol 50 MCG (2000 UT) TABS Take 2,000 mg by mouth 1 day or 1 dose.     Cyanocobalamin (VITAMIN B12) 1000 MCG TBCR Take 100 mcg by mouth daily.     hydrochlorothiazide (HYDRODIURIL) 12.5 MG tablet Take 25 mg by mouth daily.     magnesium oxide (MAG-OX) 400 MG tablet Take 1 tablet by mouth daily.     montelukast (SINGULAIR) 10 MG tablet Take 1 tablet (10 mg total) by mouth at bedtime. 14 tablet 0   potassium chloride (KLOR-CON) 8 MEQ tablet Take 8 mEq by mouth in the morning and at bedtime.     potassium chloride (KLOR-CON) 8 MEQ tablet Take 2 tablets (16 mEq total) by mouth daily with food 180 tablet 0   zolpidem (AMBIEN) 5 MG tablet Take 2.5 mg by mouth at  bedtime.     No current facility-administered medications for this encounter.     REVIEW OF SYSTEMS: On review of systems, the patient reports that she is doing well overall. She is very active and is hopeful to remain aggressive for her medical care. No breast specific complaints are noted.      PHYSICAL EXAM:  Wt Readings from Last 3 Encounters:  10/16/21 124 lb (56.2 kg)   Temp Readings from Last 3 Encounters:  09/27/21 97.9 F (36.6 C) (Oral)  09/21/21 97.9 F (36.6 C) (Oral)   BP Readings from Last 3 Encounters:  09/27/21 (!) 142/55  09/21/21 (!) 182/74   Pulse Readings from Last 3 Encounters:  09/27/21 (!) 54  09/21/21 (!) 57    In general this is a well appearing  caucasian female who appears younger than her stated age in no acute distress. She's alert and oriented x4 and appropriate throughout the examination. Cardiopulmonary assessment is negative for acute distress and she exhibits normal effort. Bilateral breast exam is deferred.    ECOG = 0  0 - Asymptomatic (Fully active, able to carry on all predisease activities without restriction)  1 - Symptomatic but completely ambulatory (Restricted in physically strenuous activity but ambulatory and able to carry out work of a light or sedentary nature. For example, light housework, office work)  2 - Symptomatic, <50% in bed during the day (Ambulatory and capable of all self care but unable to carry out any work activities. Up and about more than 50% of waking hours)  3 - Symptomatic, >50% in bed, but not bedbound (Capable of only limited self-care, confined to bed or chair 50% or more of waking hours)  4 - Bedbound (Completely disabled. Cannot carry on any self-care. Totally confined to bed or chair)  5 - Death   Eustace Pen MM, Creech RH, Tormey DC, et al. [phone]). "Toxicity and response criteria of the [facility]". [place] Oncol. 5 (6): 649-55    LABORATORY DATA:  No results found for: "WBC", "HGB", "HCT", "MCV", "PLT" No results found for: "NA", "K", "CL", "CO2" No results found for: "ALT", "AST", "GGT", "ALKPHOS", "BILITOT"    RADIOGRAPHY: MM RT BREAST BX W LOC DEV 1ST LESION IMAGE BX SPEC STEREO GUIDE  Addendum Date: 10/01/2021   ADDENDUM REPORT: 10/01/2021 09:40 ADDENDUM: Pathology revealed FOCAL HIGH-GRADE DUCTAL CARCINOMA IN SITU WITH APOCRINE FEATURES, SOLID TYPE WITHOUT NECROSIS, FOCALLY SUSPICIOUS FOR MICROINVASION, MICROCALCIFICATIONS PRESENT WITHIN DCIS of the RIGHT breast, lower outer quadrant at middle to posterior depth, (ribbon clip). This was found to be concordant by Dr. Peggye Fothergill. Pathology results were discussed with the patient and her daughter by  telephone. The patient reported doing well after the biopsy with tenderness at the site. Post biopsy instructions and care were reviewed and questions were answered. The patient was encouraged to call [facility] for any additional concerns. My direct phone number was provided. Surgical consultation has been arranged with Dr. Autumn Messing at [facility] on October 13, 2021. Pathology results reported by Terie Purser, RN on 10/01/2021. Electronically Signed   By: Evangeline Dakin M.D.   On: 10/01/2021 09:40   Result Date: 10/01/2021 CLINICAL DATA:  88 year old with an indeterminate 1.7 cm group of calcifications involving the LOWER OUTER QUADRANT of the RIGHT breast at posterior depth. EXAM: RIGHT BREAST STEREOTACTIC TOMOSYNTHESIS CORE NEEDLE BIOPSY DIAGNOSTIC RIGHT MAMMOGRAM POST STEREOTACTIC CORE NEEDLE BIOPSY COMPARISON:  Previous exams FINDINGS: The patient and I  discussed the procedure of stereotactic-guided biopsy including benefits and alternatives. We discussed the high likelihood of a successful procedure. We discussed the risks of the procedure including infection, bleeding, tissue injury, clip migration, and inadequate sampling. Informed written consent was given. The usual time out protocol was performed immediately prior to the procedure. Lesion quadrant: LOWER OUTER QUADRANT. Using sterile technique with chlorhexidine as skin antisepsis, 1% lidocaine % lidocaine with epinephrine as local anesthetic, under stereotactic tomosynthesis guidance, a 9 gauge Brevera vacuum assisted device was used to perform core needle biopsy of calcifications in the LOWER OUTER QUADRANT using a lateral approach. Specimen radiograph was performed showing calcifications in at least 1 of the 6 core samples. Specimens with calcifications are identified for pathology. At the conclusion of the procedure, a ribbon shaped tissue marker clip was deployed into the biopsy cavity. Follow-up 2D and 3D  full field CC and mediolateral mammographic images were obtained to confirm clip placement. The ribbon shaped tissue marking clip is appropriately positioned at the site of the biopsied calcifications. Expected post biopsy changes are present without evidence of hematoma. IMPRESSION: 1. Stereotactic tomosynthesis guided core needle biopsy of calcifications involving the LOWER OUTER QUADRANT of RIGHT breast. No apparent complications. 2. Appropriate positioning of the ribbon shaped tissue marking clip at the site of the biopsied calcifications in the LOWER OUTER QUADRANT at posterior depth. Electronically Signed: By: Evangeline Dakin M.D. On: 09/29/2021 08:21  MM CLIP PLACEMENT RIGHT  Addendum Date: 10/01/2021   ADDENDUM REPORT: 10/01/2021 09:40 ADDENDUM: Pathology revealed FOCAL HIGH-GRADE DUCTAL CARCINOMA IN SITU WITH APOCRINE FEATURES, SOLID TYPE WITHOUT NECROSIS, FOCALLY SUSPICIOUS FOR MICROINVASION, MICROCALCIFICATIONS PRESENT WITHIN DCIS of the RIGHT breast, lower outer quadrant at middle to posterior depth, (ribbon clip). This was found to be concordant by Dr. Peggye Fothergill. Pathology results were discussed with the patient and her daughter by telephone. The patient reported doing well after the biopsy with tenderness at the site. Post biopsy instructions and care were reviewed and questions were answered. The patient was encouraged to call [facility] for any additional concerns. My direct phone number was provided. Surgical consultation has been arranged with Dr. Autumn Messing at [facility] on October 13, 2021. Pathology results reported by Terie Purser, RN on 10/01/2021. Electronically Signed   By: Evangeline Dakin M.D.   On: 10/01/2021 09:40   Result Date: 10/01/2021 CLINICAL DATA:  88 year old with an indeterminate 1.7 cm group of calcifications involving the LOWER OUTER QUADRANT of the RIGHT breast at posterior depth. EXAM: RIGHT BREAST STEREOTACTIC TOMOSYNTHESIS  CORE NEEDLE BIOPSY DIAGNOSTIC RIGHT MAMMOGRAM POST STEREOTACTIC CORE NEEDLE BIOPSY COMPARISON:  Previous exams FINDINGS: The patient and I discussed the procedure of stereotactic-guided biopsy including benefits and alternatives. We discussed the high likelihood of a successful procedure. We discussed the risks of the procedure including infection, bleeding, tissue injury, clip migration, and inadequate sampling. Informed written consent was given. The usual time out protocol was performed immediately prior to the procedure. Lesion quadrant: LOWER OUTER QUADRANT. Using sterile technique with chlorhexidine as skin antisepsis, 1% lidocaine % lidocaine with epinephrine as local anesthetic, under stereotactic tomosynthesis guidance, a 9 gauge Brevera vacuum assisted device was used to perform core needle biopsy of calcifications in the LOWER OUTER QUADRANT using a lateral approach. Specimen radiograph was performed showing calcifications in at least 1 of the 6 core samples. Specimens with calcifications are identified for pathology. At the conclusion of the procedure, a ribbon shaped tissue marker clip was deployed  into the biopsy cavity. Follow-up 2D and 3D full field CC and mediolateral mammographic images were obtained to confirm clip placement. The ribbon shaped tissue marking clip is appropriately positioned at the site of the biopsied calcifications. Expected post biopsy changes are present without evidence of hematoma. IMPRESSION: 1. Stereotactic tomosynthesis guided core needle biopsy of calcifications involving the LOWER OUTER QUADRANT of RIGHT breast. No apparent complications. 2. Appropriate positioning of the ribbon shaped tissue marking clip at the site of the biopsied calcifications in the LOWER OUTER QUADRANT at posterior depth. Electronically Signed: By: Evangeline Dakin M.D. On: 09/29/2021 08:21   MM DIAG BREAST TOMO BILATERAL  Result Date: 09/24/2021 CLINICAL DATA:  Two year follow-up for probably  benign calcifications in the RIGHT breast. EXAM: DIGITAL DIAGNOSTIC BILATERAL MAMMOGRAM WITH TOMOSYNTHESIS AND CAD TECHNIQUE: Bilateral digital diagnostic mammography and breast tomosynthesis was performed. The images were evaluated with computer-aided detection. COMPARISON:  Previous exam(s). ACR Breast Density Category c: The breast tissue is heterogeneously dense, which may obscure small masses. FINDINGS: Magnified views are performed of calcifications in the LOWER OUTER QUADRANT of the RIGHT breast. These views demonstrate faint pleomorphic calcifications spanning 1.7 x 1.2 x 0.7 centimeters. LEFT breast is negative. IMPRESSION: Indeterminate RIGHT breast calcifications in the LOWER OUTER QUADRANT. RECOMMENDATION: Stereotactic biopsy of RIGHT breast calcifications. I have discussed the findings and recommendations with the patient. If applicable, a reminder letter will be sent to the patient regarding the next appointment. BI-RADS CATEGORY  4: Suspicious. Electronically Signed   By: Nolon Nations M.D.   On: 09/24/2021 09:12  DG Chest 2 View  Result Date: 09/21/2021 CLINICAL DATA:  Cough for >1 week EXAM: CHEST - 2 VIEW COMPARISON:  None Available. FINDINGS: The cardiomediastinal silhouette is normal in contour. Tortuous thoracic aorta. No pleural effusion. No pneumothorax. No acute pleuroparenchymal abnormality. Visualized abdomen is unremarkable. No acute osseous abnormality noted. IMPRESSION: No acute cardiopulmonary abnormality. Electronically Signed   By: Valentino Saxon M.D.   On: 09/21/2021 16:53       IMPRESSION/PLAN: 1. High-grade ER/PR positive DCIS, cannot rule out microinvasion of the right breast. Dr. Lisbeth Renshaw discusses the pathology findings and reviews the nature of early stage breast disease. She is planning right lumpectomy. Dr. Lisbeth Renshaw discusses the rationale to consider external radiotherapy to the breast  to reduce risks of local recurrence followed by antiestrogen therapy, Dr. Lisbeth Renshaw  also discusses cases in which radiation may be optional for favorable cases based on final pathology.  We discussed the risks, benefits, short, and long term effects of radiotherapy, as well as the curative intent, and the patient is interested in proceeding. Dr. Lisbeth Renshaw discusses the delivery and logistics of radiotherapy and anticipates a course of up to 4 weeks of radiotherapy to the right breast. We will see her back a few weeks after surgery to discuss results of surgery, and if she proceeds, coordinate the simulation process and anticipate we starting radiotherapy about 4-6 weeks after surgery.     This encounter was provided by telemedicine platform MyChart.  The patient has provided two factor identification and has given verbal consent for this type of encounter and has been advised to only accept a meeting of this type in a secure network environment. The time spent during this encounter was 60 minutes including preparation, discussion, and coordination of the patient's care. The attendants for this meeting include Blenda Nicely, RN, Dr. Lisbeth Renshaw, Hayden Pedro  and Patricia Mclaughlin and her son Patricia Mclaughlin. During the encounter,  Blenda Nicely, RN, Dr. Lisbeth Renshaw, and Hayden Pedro were located at [facility] Radiation Oncology Department.  Patricia Mclaughlin was located at her daughter's home in [place].    The above documentation reflects my direct findings during this shared patient visit. Please see the separate note by Dr. Lisbeth Renshaw on this date for the remainder of the patient's plan of care.    Carola Rhine, [facility]    **Disclaimer: This note was dictated with voice recognition software. Similar sounding words can inadvertently be transcribed and this note may contain transcription errors which may not have been corrected upon publication of note.**

## 2021-10-28 ENCOUNTER — Inpatient Hospital Stay: Payer: Medicare Other | Attending: Hematology and Oncology | Admitting: Hematology and Oncology

## 2021-10-28 ENCOUNTER — Encounter: Payer: Self-pay | Admitting: *Deleted

## 2021-10-28 DIAGNOSIS — D0511 Intraductal carcinoma in situ of right breast: Secondary | ICD-10-CM

## 2021-10-28 NOTE — Assessment & Plan Note (Signed)
09/29/2021:Screening mammogram detected pleomorphic calcifications LOQ right breast: 1.7 x 1.2 x 0.7 cm.  Right breast biopsy lower outer quadrant: Focal high-grade DCIS with apocrine features solid type without necrosis, focally suspicious for microinvasion, ER 90%, PR 70%  Pathology review: I discussed with the patient the difference between DCIS and invasive breast cancer. It is considered a precancerous lesion. DCIS is classified as a 0. It is generally detected through mammograms as calcifications. We discussed the significance of grades and its impact on prognosis. We also discussed the importance of ER and PR receptors and their implications to adjuvant treatment options. Prognosis of DCIS dependence on grade, comedo necrosis. It is anticipated that if not treated, 20-30% of DCIS can develop into invasive breast cancer.  Recommendation: 1. Breast conserving surgery 2. Followed by adjuvant radiation therapy 3. Followed by antiestrogen therapy with tamoxifen 5 years  Tamoxifen counseling: We discussed the risks and benefits of tamoxifen. These include but not limited to insomnia, hot flashes, mood changes, vaginal dryness, and weight gain. Although rare, serious side effects including endometrial cancer, risk of blood clots were also discussed. We strongly believe that the benefits far outweigh the risks. Patient understands these risks and consented to starting treatment. Planned treatment duration is 5 years.  Return to clinic after surgery to discuss the final pathology report and come up with an adjuvant treatment plan.

## 2021-10-28 NOTE — Progress Notes (Addendum)
HEMATOLOGY-ONCOLOGY [place] VISIT PROGRESS NOTE  I connected with Patricia Mclaughlin on 10/28/2021 at  1:00 PM EDT by Mychart video conference and verified that I am speaking with the correct person using two identifiers.  I discussed the limitations, risks, security and privacy concerns of performing an evaluation and management service by Webex and the availability of in person appointments.  I also discussed with the patient that there may be a patient responsible charge related to this service. The patient expressed understanding and agreed to proceed.  Patient's Location: Home Physician Location: Clinic  CHIEF COMPLIANT: Newly diagnosed right breast DCIS  INTERVAL HISTORY: Patricia Mclaughlin is a 88 y.o. female has been recently diagnosed with DCIS involving the right breast.  She had routine screening mammogram that detected a 1.7 cm area of calcifications which on biopsy came back as high-grade DCIS that was ER/PR positive.  She was seen by surgery who recommended lumpectomy.  She is connecting with me to discuss the pros and cons of adjuvant therapy and to discuss overall treatment plan.  She connected with me through [place].  Oncology History  Ductal carcinoma in situ (DCIS) of right breast  09/29/2021 Initial Diagnosis   Screening mammogram detected pleomorphic calcifications LOQ right breast: 1.7 x 1.2 x 0.7 cm.  Right breast biopsy lower outer quadrant: Focal high-grade DCIS with apocrine features solid type without necrosis, focally suspicious for microinvasion, ER 90%, PR 70%   10/28/2021 Cancer Staging   Staging form: Breast, AJCC 8th Edition - Clinical: Stage 0 (cTis (DCIS), cN0, cM0, G3, ER+, PR+, HER2: Not Assessed) - Signed by Nicholas Lose, MD on 10/28/2021 Stage prefix: Initial diagnosis Histologic grading system: 3 grade system     REVIEW OF SYSTEMS:   All other systems were reviewed with the patient and are negative.  Observations/Objective:    Assessment  Plan:  Ductal carcinoma in situ (DCIS) of right breast 09/29/2021:Screening mammogram detected pleomorphic calcifications LOQ right breast: 1.7 x 1.2 x 0.7 cm.  Right breast biopsy lower outer quadrant: Focal high-grade DCIS with apocrine features solid type without necrosis, focally suspicious for microinvasion, ER 90%, PR 70%  Pathology review: I discussed with the patient the difference between DCIS and invasive breast cancer. It is considered a precancerous lesion. DCIS is classified as a 0. It is generally detected through mammograms as calcifications. We discussed the significance of grades and its impact on prognosis. We also discussed the importance of ER and PR receptors and their implications to adjuvant treatment options. Prognosis of DCIS dependence on grade, comedo necrosis. It is anticipated that if not treated, 20-30% of DCIS can develop into invasive breast cancer.  Recommendation: 1. Breast conserving surgery 2. given her age, I leaning against giving her antiestrogen therapy.  We will discuss the pros and cons after her surgery to make sure that there is no invasive cancer. Surgery date has been set for 11/17/2021.  MyChart virtual visit after surgery to discuss the final pathology report and come up with an adjuvant treatment plan.   I discussed the assessment and treatment plan with the patient. The patient was provided an opportunity to ask questions and all were answered. The patient agreed with the plan and demonstrated an understanding of the instructions. The patient was advised to call back or seek an in-person evaluation if the symptoms worsen or if the condition fails to improve as anticipated.   I provided 45 minutes of face-to-face Web Ex time as well as time for charting documentation and  coordination of care during this encounter.    Rulon Eisenmenger, MD 10/28/2021  I Gardiner Coins am scribing for Dr. Lindi Adie  I have reviewed the above documentation for accuracy  and completeness, and I agree with the above.

## 2021-11-05 ENCOUNTER — Encounter: Payer: Self-pay | Admitting: *Deleted

## 2021-11-06 ENCOUNTER — Telehealth: Payer: Self-pay | Admitting: Hematology and Oncology

## 2021-11-06 NOTE — Telephone Encounter (Signed)
Scheduled appointment per 7/19 staff message. Left voicemail.

## 2021-11-11 ENCOUNTER — Other Ambulatory Visit: Payer: Self-pay

## 2021-11-11 ENCOUNTER — Encounter (HOSPITAL_BASED_OUTPATIENT_CLINIC_OR_DEPARTMENT_OTHER): Payer: Self-pay | Admitting: General Surgery

## 2021-11-14 ENCOUNTER — Encounter (HOSPITAL_BASED_OUTPATIENT_CLINIC_OR_DEPARTMENT_OTHER)
Admission: RE | Admit: 2021-11-14 | Discharge: 2021-11-14 | Disposition: A | Discharge status: Home / Self Care | Payer: Medicare Other | Source: Ambulatory Visit | Attending: General Surgery | Admitting: General Surgery

## 2021-11-14 ENCOUNTER — Ambulatory Visit
Admission: RE | Admit: 2021-11-14 | Discharge: 2021-11-14 | Disposition: A | Discharge status: Home / Self Care | Payer: Medicare Other | Source: Ambulatory Visit | Attending: General Surgery | Admitting: General Surgery

## 2021-11-14 DIAGNOSIS — Z01818 Encounter for other preprocedural examination: Secondary | ICD-10-CM | POA: Diagnosis present

## 2021-11-14 DIAGNOSIS — Z79899 Other long term (current) drug therapy: Secondary | ICD-10-CM | POA: Insufficient documentation

## 2021-11-14 DIAGNOSIS — D0511 Intraductal carcinoma in situ of right breast: Secondary | ICD-10-CM

## 2021-11-14 LAB — BASIC METABOLIC PANEL
Anion gap: 5 (ref 5–15)
BUN: 17 mg/dL (ref 8–23)
CO2: 30 mmol/L (ref 22–32)
Calcium: 10.5 mg/dL — ABNORMAL HIGH (ref 8.9–10.3)
Chloride: 105 mmol/L (ref 98–111)
Creatinine, Ser: 0.77 mg/dL (ref 0.44–1.00)
GFR, Estimated: >60 mL/min (ref >60)
Glucose, Bld: 117 mg/dL — ABNORMAL HIGH (ref 70–99)
Potassium: 4.1 mmol/L (ref 3.5–5.1)
Sodium: 140 mmol/L (ref 135–145)

## 2021-11-14 NOTE — Progress Notes (Signed)
Ensure presurgery drink given with instruction to drink by 0500 DOS. CHG soap given with instruction taped to bottle. "DOS Checklist" instruction page also sent with pt. Pt verbalized understanding.     Enhanced Recovery after Surgery for Orthopedics Enhanced Recovery after Surgery is a protocol used to improve the stress on your body and your recovery after surgery.  Patient Instructions  The night before surgery:  No food after midnight. ONLY clear liquids after midnight  The day of surgery (if you do NOT have diabetes):  Drink ONE (1) Pre-Surgery Clear Ensure as directed.   This drink was given to you during your hospital  pre-op appointment visit. The pre-op nurse will instruct you on the time to drink the  Pre-Surgery Ensure depending on your surgery time. Finish the drink at the designated time by the pre-op nurse.  Nothing else to drink after completing the  Pre-Surgery Clear Ensure.  The day of surgery (if you have diabetes): Drink ONE (1) Gatorade 2 (G2) as directed. This drink was given to you during your hospital  pre-op appointment visit.  The pre-op nurse will instruct you on the time to drink the   Gatorade 2 (G2) depending on your surgery time. Color of the Gatorade may vary. Red is not allowed. Nothing else to drink after completing the  Gatorade 2 (G2).         If you have questions, please contact your surgeon's office.

## 2021-11-16 NOTE — Anesthesia Preprocedure Evaluation (Signed)
Anesthesia Evaluation  Patient identified by MRN, date of birth, ID band Patient awake    Reviewed: Allergy & Precautions, NPO status , Patient's Chart, lab work & pertinent test results  Airway Mallampati: II  TM Distance: >3 FB Neck ROM: Full    Dental  (+) Dental Advisory Given, Teeth Intact   Pulmonary neg pulmonary ROS,    Pulmonary exam normal breath sounds clear to auscultation       Cardiovascular hypertension, Pt. on medications and Pt. on home beta blockers Normal cardiovascular exam Rhythm:Regular Rate:Normal     Neuro/Psych negative neurological ROS     GI/Hepatic negative GI ROS, Neg liver ROS,   Endo/Other  negative endocrine ROS  Renal/GU negative Renal ROS     Musculoskeletal negative musculoskeletal ROS (+)   Abdominal   Peds  Hematology negative hematology ROS (+)   Anesthesia Other Findings   Reproductive/Obstetrics negative OB ROS                            Anesthesia Physical Anesthesia Plan  ASA: 3  Anesthesia Plan: General   Post-op Pain Management: Tylenol PO (pre-op)* and Celebrex PO (pre-op)*   Induction: Intravenous  PONV Risk Score and Plan: 3 and Treatment may vary due to age or medical condition, Ondansetron and Dexamethasone  Airway Management Planned: LMA  Additional Equipment: None  Intra-op Plan:   Post-operative Plan: Extubation in OR  Informed Consent: I have reviewed the patients History and Physical, chart, labs and discussed the procedure including the risks, benefits and alternatives for the proposed anesthesia with the patient or authorized representative who has indicated his/her understanding and acceptance.     Dental advisory given  Plan Discussed with: CRNA  Anesthesia Plan Comments:        Anesthesia Quick Evaluation

## 2021-11-17 ENCOUNTER — Ambulatory Visit
Admission: RE | Admit: 2021-11-17 | Discharge: 2021-11-17 | Disposition: A | Discharge status: Home / Self Care | Payer: Medicare Other | Source: Ambulatory Visit | Attending: General Surgery | Admitting: General Surgery

## 2021-11-17 ENCOUNTER — Ambulatory Visit (HOSPITAL_BASED_OUTPATIENT_CLINIC_OR_DEPARTMENT_OTHER): Payer: Medicare Other | Admitting: Anesthesiology

## 2021-11-17 ENCOUNTER — Other Ambulatory Visit (HOSPITAL_COMMUNITY): Payer: Self-pay

## 2021-11-17 ENCOUNTER — Encounter (HOSPITAL_BASED_OUTPATIENT_CLINIC_OR_DEPARTMENT_OTHER)
Admission: RE | Disposition: A | Discharge status: Home / Self Care | Payer: Self-pay | Source: Home / Self Care | Attending: General Surgery

## 2021-11-17 ENCOUNTER — Encounter (HOSPITAL_BASED_OUTPATIENT_CLINIC_OR_DEPARTMENT_OTHER): Payer: Self-pay | Admitting: General Surgery

## 2021-11-17 ENCOUNTER — Ambulatory Visit (HOSPITAL_BASED_OUTPATIENT_CLINIC_OR_DEPARTMENT_OTHER)
Admission: RE | Admit: 2021-11-17 | Discharge: 2021-11-17 | Disposition: A | Discharge status: Home / Self Care | Payer: Medicare Other | Attending: General Surgery | Admitting: General Surgery

## 2021-11-17 ENCOUNTER — Other Ambulatory Visit: Payer: Self-pay

## 2021-11-17 DIAGNOSIS — C50511 Malignant neoplasm of lower-outer quadrant of right female breast: Secondary | ICD-10-CM | POA: Insufficient documentation

## 2021-11-17 DIAGNOSIS — Z170 Estrogen receptor positive status [ER+]: Secondary | ICD-10-CM | POA: Insufficient documentation

## 2021-11-17 DIAGNOSIS — D0511 Intraductal carcinoma in situ of right breast: Secondary | ICD-10-CM | POA: Diagnosis not present

## 2021-11-17 DIAGNOSIS — I10 Essential (primary) hypertension: Secondary | ICD-10-CM | POA: Diagnosis not present

## 2021-11-17 DIAGNOSIS — Z79899 Other long term (current) drug therapy: Secondary | ICD-10-CM

## 2021-11-17 HISTORY — DX: Insomnia, unspecified: G47.00

## 2021-11-17 HISTORY — DX: Intraductal carcinoma in situ of unspecified breast: D05.10

## 2021-11-17 HISTORY — PX: BREAST LUMPECTOMY WITH RADIOACTIVE SEED LOCALIZATION: SHX6424

## 2021-11-17 SURGERY — BREAST LUMPECTOMY WITH RADIOACTIVE SEED LOCALIZATION
Anesthesia: General | Site: Breast | Laterality: Right

## 2021-11-17 MED ORDER — CHLORHEXIDINE GLUCONATE CLOTH 2 % EX PADS: 6.0000 | MEDICATED_PAD | Freq: Once | CUTANEOUS | Status: DC

## 2021-11-17 MED ORDER — OXYCODONE HCL 5 MG PO TABS
5.0000 mg | ORAL_TABLET | Freq: Four times a day (QID) | ORAL | 0 refills | Status: DC | PRN
  Filled 2021-11-17: qty 10, 3d supply, fill #0

## 2021-11-17 MED ORDER — EPHEDRINE SULFATE (PRESSORS) 50 MG/ML IJ SOLN
INTRAMUSCULAR | Status: DC | PRN
  Administered 2021-11-17: 10 mg via INTRAVENOUS
  Administered 2021-11-17: 5 mg via INTRAVENOUS

## 2021-11-17 MED ORDER — CELECOXIB 100 MG PO CAPS
ORAL_CAPSULE | ORAL | Status: AC
  Filled 2021-11-17: qty 1

## 2021-11-17 MED ORDER — FENTANYL CITRATE (PF) 100 MCG/2ML IJ SOLN
INTRAMUSCULAR | Status: AC
  Filled 2021-11-17: qty 2

## 2021-11-17 MED ORDER — FENTANYL CITRATE (PF) 100 MCG/2ML IJ SOLN: 25.0000 ug | INTRAMUSCULAR | Status: DC | PRN

## 2021-11-17 MED ORDER — OXYCODONE HCL 5 MG PO TABS: 5.0000 mg | ORAL_TABLET | Freq: Once | ORAL | Status: DC | PRN

## 2021-11-17 MED ORDER — CEFAZOLIN SODIUM-DEXTROSE 2-4 GM/100ML-% IV SOLN
INTRAVENOUS | Status: AC
  Filled 2021-11-17: qty 100

## 2021-11-17 MED ORDER — CELECOXIB 200 MG PO CAPS: 200.0000 mg | ORAL_CAPSULE | Freq: Once | ORAL | Status: DC

## 2021-11-17 MED ORDER — LACTATED RINGERS IV SOLN: INTRAVENOUS | Status: DC

## 2021-11-17 MED ORDER — FENTANYL CITRATE (PF) 100 MCG/2ML IJ SOLN
INTRAMUSCULAR | Status: DC | PRN
Start: 2021-11-17 — End: 2021-11-17
  Administered 2021-11-17 (×2): 25 ug via INTRAVENOUS

## 2021-11-17 MED ORDER — CEFAZOLIN SODIUM-DEXTROSE 2-4 GM/100ML-% IV SOLN
2.0000 g | INTRAVENOUS | Status: AC
  Administered 2021-11-17: 2 g via INTRAVENOUS

## 2021-11-17 MED ORDER — CELECOXIB 100 MG PO CAPS
100.0000 mg | ORAL_CAPSULE | ORAL | Status: AC
  Administered 2021-11-17: 100 mg via ORAL

## 2021-11-17 MED ORDER — GLYCOPYRROLATE PF 0.2 MG/ML IJ SOSY
PREFILLED_SYRINGE | INTRAMUSCULAR | Status: AC
  Filled 2021-11-17: qty 1

## 2021-11-17 MED ORDER — ACETAMINOPHEN 325 MG PO TABS: 650.0000 mg | ORAL_TABLET | Freq: Four times a day (QID) | ORAL | Status: DC | PRN

## 2021-11-17 MED ORDER — ONDANSETRON HCL 4 MG/2ML IJ SOLN
INTRAMUSCULAR | Status: AC
  Filled 2021-11-17: qty 2

## 2021-11-17 MED ORDER — PHENYLEPHRINE 80 MCG/ML (10ML) SYRINGE FOR IV PUSH (FOR BLOOD PRESSURE SUPPORT)
PREFILLED_SYRINGE | INTRAVENOUS | Status: AC
  Filled 2021-11-17: qty 10

## 2021-11-17 MED ORDER — ACETAMINOPHEN 500 MG PO TABS
1000.0000 mg | ORAL_TABLET | ORAL | Status: AC
  Administered 2021-11-17: 1000 mg via ORAL

## 2021-11-17 MED ORDER — PHENYLEPHRINE HCL (PRESSORS) 10 MG/ML IV SOLN
INTRAVENOUS | Status: DC | PRN
  Administered 2021-11-17 (×7): 40 ug via INTRAVENOUS

## 2021-11-17 MED ORDER — AMISULPRIDE (ANTIEMETIC) 5 MG/2ML IV SOLN: 5.0000 mg | Freq: Once | INTRAVENOUS | Status: DC | PRN

## 2021-11-17 MED ORDER — DEXAMETHASONE SODIUM PHOSPHATE 10 MG/ML IJ SOLN
INTRAMUSCULAR | Status: DC | PRN
  Administered 2021-11-17: 4 mg via INTRAVENOUS

## 2021-11-17 MED ORDER — BUPIVACAINE-EPINEPHRINE (PF) 0.25% -1:200000 IJ SOLN
INTRAMUSCULAR | Status: DC | PRN
  Administered 2021-11-17: 19 mL

## 2021-11-17 MED ORDER — ACETAMINOPHEN 500 MG PO TABS
ORAL_TABLET | ORAL | Status: AC
  Filled 2021-11-17: qty 2

## 2021-11-17 MED ORDER — LIDOCAINE HCL (CARDIAC) PF 100 MG/5ML IV SOSY
PREFILLED_SYRINGE | INTRAVENOUS | Status: DC | PRN
  Administered 2021-11-17: 50 mg via INTRAVENOUS

## 2021-11-17 MED ORDER — GABAPENTIN 100 MG PO CAPS: 100.0000 mg | ORAL_CAPSULE | ORAL | Status: DC

## 2021-11-17 MED ORDER — PROPOFOL 10 MG/ML IV BOLUS
INTRAVENOUS | Status: DC | PRN
  Administered 2021-11-17: 90 mg via INTRAVENOUS
  Administered 2021-11-17 (×2): 30 mg via INTRAVENOUS

## 2021-11-17 MED ORDER — GLYCOPYRROLATE 0.2 MG/ML IJ SOLN
INTRAMUSCULAR | Status: DC | PRN
  Administered 2021-11-17 (×2): .1 mg via INTRAVENOUS

## 2021-11-17 MED ORDER — ONDANSETRON HCL 4 MG/2ML IJ SOLN
INTRAMUSCULAR | Status: DC | PRN
  Administered 2021-11-17: 4 mg via INTRAVENOUS

## 2021-11-17 MED ORDER — OXYCODONE HCL 5 MG/5ML PO SOLN: 5.0000 mg | Freq: Once | ORAL | Status: DC | PRN

## 2021-11-17 MED ORDER — PROPOFOL 10 MG/ML IV BOLUS
INTRAVENOUS | Status: AC
  Filled 2021-11-17: qty 20

## 2021-11-17 SURGICAL SUPPLY — 40 items
ADH SKN CLS APL DERMABOND .7 (GAUZE/BANDAGES/DRESSINGS) ×1
APL PRP STRL LF DISP 70% ISPRP (MISCELLANEOUS) ×1
APPLIER CLIP 9.375 MED OPEN (MISCELLANEOUS)
APR CLP MED 9.3 20 MLT OPN (MISCELLANEOUS)
BINDER BREAST LRG (GAUZE/BANDAGES/DRESSINGS) ×1 IMPLANT
BLADE SURG 15 STRL LF DISP TIS (BLADE) ×1 IMPLANT
BLADE SURG 15 STRL SS (BLADE) ×2
CANISTER SUC SOCK COL 7IN (MISCELLANEOUS) ×2 IMPLANT
CANISTER SUCT 1200ML W/VALVE (MISCELLANEOUS) ×2 IMPLANT
CHLORAPREP W/TINT 26 (MISCELLANEOUS) ×2 IMPLANT
CLIP APPLIE 9.375 MED OPEN (MISCELLANEOUS) IMPLANT
COVER BACK TABLE 60X90IN (DRAPES) ×2 IMPLANT
COVER MAYO STAND STRL (DRAPES) ×2 IMPLANT
COVER PROBE W GEL 5X96 (DRAPES) ×2 IMPLANT
DERMABOND ADVANCED (GAUZE/BANDAGES/DRESSINGS) ×1
DERMABOND ADVANCED .7 DNX12 (GAUZE/BANDAGES/DRESSINGS) ×1 IMPLANT
DRAPE LAPAROSCOPIC ABDOMINAL (DRAPES) ×2 IMPLANT
DRAPE UTILITY XL STRL (DRAPES) ×2 IMPLANT
ELECT COATED BLADE 2.86 ST (ELECTRODE) ×2 IMPLANT
ELECT REM PT RETURN 9FT ADLT (ELECTROSURGICAL) ×2
ELECTRODE REM PT RTRN 9FT ADLT (ELECTROSURGICAL) ×1 IMPLANT
GLOVE BIO SURGEON STRL SZ7.5 (GLOVE) ×4 IMPLANT
GOWN STRL REUS W/ TWL LRG LVL3 (GOWN DISPOSABLE) ×2 IMPLANT
GOWN STRL REUS W/TWL LRG LVL3 (GOWN DISPOSABLE) ×4
KIT MARKER MARGIN INK (KITS) ×2 IMPLANT
NDL HYPO 25X1 1.5 SAFETY (NEEDLE) IMPLANT
NEEDLE HYPO 25X1 1.5 SAFETY (NEEDLE) ×2 IMPLANT
NS IRRIG 1000ML POUR BTL (IV SOLUTION) IMPLANT
PACK BASIN DAY SURGERY FS (CUSTOM PROCEDURE TRAY) ×2 IMPLANT
PENCIL SMOKE EVACUATOR (MISCELLANEOUS) ×2 IMPLANT
SLEEVE SCD COMPRESS KNEE MED (STOCKING) ×2 IMPLANT
SPONGE T-LAP 18X18 ~~LOC~~+RFID (SPONGE) ×2 IMPLANT
SUT MON AB 4-0 PC3 18 (SUTURE) ×2 IMPLANT
SUT SILK 2 0 SH (SUTURE) IMPLANT
SUT VICRYL 3-0 CR8 SH (SUTURE) ×2 IMPLANT
SYR CONTROL 10ML LL (SYRINGE) ×1 IMPLANT
TOWEL GREEN STERILE FF (TOWEL DISPOSABLE) ×2 IMPLANT
TRAY FAXITRON CT DISP (TRAY / TRAY PROCEDURE) ×2 IMPLANT
TUBE CONNECTING 20X1/4 (TUBING) ×2 IMPLANT
YANKAUER SUCT BULB TIP NO VENT (SUCTIONS) IMPLANT

## 2021-11-17 NOTE — Transfer of Care (Signed)
Immediate Anesthesia Transfer of Care Note  Patient: Patricia Mclaughlin  Procedure(s) Performed: RIGHT BREAST RADIOACTIVE SEED LOCALIZED LUMPECTOMY (Right: Breast)  Patient Location: PACU  Anesthesia Type:General  Level of Consciousness: awake, alert , oriented and patient cooperative  Airway & Oxygen Therapy: Patient Spontanous Breathing and Patient connected to face mask oxygen  Post-op Assessment: Report given to RN and Post -op Vital signs reviewed and stable  Post vital signs: Reviewed and stable  Last Vitals:  Vitals Value Taken Time  BP 143/57 11/17/21 0943  Temp    Pulse 77 11/17/21 0944  Resp 16 11/17/21 0944  SpO2 100 % 11/17/21 0944  Vitals shown include unvalidated device data.  Last Pain:  Vitals:   11/17/21 0718  TempSrc: Oral  PainSc: 0-No pain         Complications: No notable events documented.

## 2021-11-17 NOTE — Interval H&P Note (Signed)
History and Physical Interval Note:  11/17/2021 8:11 AM  Patricia Mclaughlin  has presented today for surgery, with the diagnosis of RIGHT BREAST DUCTAL CARCINOMA IN SITU.  The various methods of treatment have been discussed with the patient and family. After consideration of risks, benefits and other options for treatment, the patient has consented to  Procedure(s): RIGHT BREAST RADIOACTIVE SEED LOCALIZED LUMPECTOMY (Right) as a surgical intervention.  The patient's history has been reviewed, patient examined, no change in status, stable for surgery.  I have reviewed the patient's chart and labs.  Questions were answered to the patient's satisfaction.     Autumn Messing III

## 2021-11-17 NOTE — Op Note (Signed)
11/17/2021  9:34 AM  PATIENT:  Patricia Mclaughlin  88 y.o. female  PRE-OPERATIVE DIAGNOSIS:  RIGHT BREAST DUCTAL CARCINOMA IN SITU  POST-OPERATIVE DIAGNOSIS:  RIGHT BREAST DUCTAL CARCINOMA IN SITU  PROCEDURE:  Procedure(s): RIGHT BREAST RADIOACTIVE SEED LOCALIZED LUMPECTOMY (Right)  SURGEON:  Surgeon(s) and Role:    * Jovita Kussmaul, MD - Primary  PHYSICIAN ASSISTANT:   ASSISTANTS: none   ANESTHESIA:   local and general  EBL:  5 mL   BLOOD ADMINISTERED:none  DRAINS: none   LOCAL MEDICATIONS USED:  MARCAINE     SPECIMEN:  Source of Specimen:  right breast tissue with additional lateral margin  DISPOSITION OF SPECIMEN:  PATHOLOGY  COUNTS:  YES  TOURNIQUET:  * No tourniquets in log *  DICTATION: .Dragon Dictation  After informed consent was obtained the patient was brought to the operating room and placed in the supine position on the operating table.  After adequate induction of general anesthesia the patient's right breast was prepped with ChloraPrep, allowed to dry, and draped in usual sterile manner.  An appropriate timeout was performed.  Previously an I-125 seed was placed in the lower outer quadrant of the right breast to mark an area of ductal carcinoma in situ.  The neoprobe was set to I-125 in the area of radioactivity was readily identified.  The area around this was infiltrated with quarter percent Marcaine.  An crescent shaped incision was made in the skin overlying the area of radioactivity in the lower outer quadrant of the right breast with a 15 blade knife.  The incision was carried through the skin and subcutaneous tissue sharply with the electrocautery.  Dissection was then carried widely around the radioactive seed while checking the area of radioactivity frequently.  Once the specimen was removed it was oriented with the appropriate paint colors.  A specimen radiograph was obtained that showed the clip and seed to be near the center of the specimen.  I did  elect to take an additional lateral margin based off the appearance of the specimen radiograph.  This was marked appropriately and also sent to pathology for further evaluation.  Hemostasis was achieved using the Bovie electrocautery.  The wound was irrigated with saline and infiltrated with more quarter percent Marcaine.  The deep layer of the incision was then closed with interrupted 3-0 Vicryl stitches.  The skin was then closed with a running 4-0 Monocryl subcuticular stitch.  Dermabond dressings were applied.  The patient tolerated the procedure well.  At the end of the case all needle sponge and instrument counts were correct.  The patient was then awakened and taken to recovery in stable condition.  PLAN OF CARE: Discharge to home after PACU  PATIENT DISPOSITION:  PACU - hemodynamically stable.   Delay start of Pharmacological VTE agent (>24hrs) due to surgical blood loss or risk of bleeding: not applicable

## 2021-11-17 NOTE — Discharge Instructions (Addendum)
  Post Anesthesia Home Care Instructions  Activity: Get plenty of rest for the remainder of the day. A responsible individual must stay with you for 24 hours following the procedure.  For the next 24 hours, DO NOT: -Drive a car -Paediatric nurse -Drink alcoholic beverages -Take any medication unless instructed by your physician -Make any legal decisions or sign important papers.  Meals: Start with liquid foods such as gelatin or soup. Progress to regular foods as tolerated. Avoid greasy, spicy, heavy foods. If nausea and/or vomiting occur, drink only clear liquids until the nausea and/or vomiting subsides. Call your physician if vomiting continues.  Special Instructions/Symptoms: Your throat may feel dry or sore from the anesthesia or the breathing tube placed in your throat during surgery. If this causes discomfort, gargle with warm salt water. The discomfort should disappear within 24 hours.  If you had a scopolamine patch placed behind your ear for the management of post- operative nausea and/or vomiting:  1. The medication in the patch is effective for 72 hours, after which it should be removed.  Wrap patch in a tissue and discard in the trash. Wash hands thoroughly with soap and water. 2. You may remove the patch earlier than 72 hours if you experience unpleasant side effects which may include dry mouth, dizziness or visual disturbances. 3. Avoid touching the patch. Wash your hands with soap and water after contact with the patch.     No Tylenol or Ibuprofen until after 1:30pm today.

## 2021-11-17 NOTE — H&P (Signed)
REFERRING PHYSICIAN: Enis Slipper, MD  PROVIDER: Landry Corporal, MD  MRN: [id number] DOB: [birth year] Subjective   Chief Complaint: Breast Cancer   History of Present Illness: Patricia Mclaughlin is a 88 y.o. female who is seen today as an office consultation for evaluation of Breast Cancer .   We are asked to see the patient in consultation by Dr. Tad Moore to evaluate her for a new right breast cancer. The patient is a 88 year old white female who recently went for a routine screening mammogram. At that time she was found to have 1.7 cm abnormal calcification in the lower outer quadrant of the right breast. This was biopsied and came back as high-grade DCIS that was ER and PR positive. She is otherwise in good health and does not smoke.  Review of Systems: A complete review of systems was obtained from the patient. I have reviewed this information and discussed as appropriate with the patient. See HPI as well for other ROS.  ROS   Medical History: Past Medical History:  Diagnosis Date  Hyperlipidemia  Hypertension   Patient Active Problem List  Diagnosis  Ductal carcinoma in situ (DCIS) of right breast   Past Surgical History:  Procedure Laterality Date  HYSTERECTOMY    Allergies  Allergen Reactions  Ace Inhibitors Other (See Comments)   Current Outpatient Medications on File Prior to Visit  Medication Sig Dispense Refill  ALLERGY RELIEF, FEXOFENADINE, 180 mg tablet  amLODIPine (NORVASC) 2.5 MG tablet Take by mouth  aspirin 81 MG EC tablet Take by mouth  atorvastatin (LIPITOR) 80 MG tablet Take by mouth  bisoprolol (ZEBETA) 10 MG tablet Take 1 tablet by mouth 2 (two) times daily  cholecalciferol (VITAMIN D3) 2,000 unit tablet Take by mouth  cyanocobalamin (VITAMIN B12) 1000 MCG tablet Take by mouth  magnesium oxide (MAG-OX) 400 mg (241.3 mg magnesium) tablet Take 1 tablet by mouth once daily  potassium chloride (KLOR-CON) 8 MEQ ER tablet Take by mouth  zolpidem  (AMBIEN) 5 MG tablet Take by mouth   No current facility-administered medications on file prior to visit.   Family History  Problem Relation Age of Onset  Breast cancer Mother  Skin cancer Father    Social History   Tobacco Use  Smoking Status Never  Smokeless Tobacco Never    Social History   Socioeconomic History  Marital status: Married  Tobacco Use  Smoking status: Never  Smokeless tobacco: Never   Objective:   Vitals:  BP: 120/78  Pulse: 63  Weight: 56.2 kg (124 lb)  Height: 149.9 cm ('4\' 11"'$ )   Body mass index is 25.04 kg/m.  Physical Exam Vitals reviewed.  Constitutional:  General: She is not in acute distress. Appearance: Normal appearance.  HENT:  Head: Normocephalic and atraumatic.  Right Ear: External ear normal.  Left Ear: External ear normal.  Nose: Nose normal.  Mouth/Throat:  Mouth: Mucous membranes are moist.  Pharynx: Oropharynx is clear.  Eyes:  General: No scleral icterus. Extraocular Movements: Extraocular movements intact.  Conjunctiva/sclera: Conjunctivae normal.  Pupils: Pupils are equal, round, and reactive to light.  Cardiovascular:  Rate and Rhythm: Normal rate and regular rhythm.  Pulses: Normal pulses.  Heart sounds: Normal heart sounds.  Pulmonary:  Effort: Pulmonary effort is normal. No respiratory distress.  Breath sounds: Normal breath sounds.  Abdominal:  General: Bowel sounds are normal.  Palpations: Abdomen is soft.  Tenderness: There is no abdominal tenderness.  Musculoskeletal:  General: No swelling, tenderness or deformity. Normal range of  motion.  Cervical back: Normal range of motion and neck supple.  Skin: General: Skin is warm and dry.  Coloration: Skin is not jaundiced.  Neurological:  General: No focal deficit present.  Mental Status: She is alert and oriented to person, place, and time.  Psychiatric:  Mood and Affect: Mood normal.  Behavior: Behavior normal.    Breast: There is no palpable mass  in either breast. There is no palpable axillary, supraclavicular, or cervical lymphadenopathy.  Labs, Imaging and Diagnostic Testing:  Assessment and Plan:   Diagnoses and all orders for this visit:  Ductal carcinoma in situ (DCIS) of right breast - Ambulatory Referral to Oncology-Medical - Ambulatory Referral to Radiation Oncology - [facility] Case Posting Request; Future    The patient appears to have a 1.7 cm area of DCIS in the lower outer quadrant of the right breast with favorable markers. I have discussed with her in detail the different options for treatment and at this point she favors breast conservation which I feel is very reasonable. I have discussed with her in detail the risks and benefits of the operation as well as some of the technical aspects including use of a radioactive seed for localization and she understands and wishes to proceed. I will refer her to medical and radiation oncology to discuss adjuvant therapy.

## 2021-11-17 NOTE — Anesthesia Procedure Notes (Signed)
Procedure Name: LMA Insertion Date/Time: 11/17/2021 8:50 AM  Performed by: Garrel Ridgel, CRNAPre-anesthesia Checklist: Patient identified, Emergency Drugs available, Suction available and Patient being monitored Patient Re-evaluated:Patient Re-evaluated prior to induction Oxygen Delivery Method: Circle system utilized Preoxygenation: Pre-oxygenation with 100% oxygen Induction Type: IV induction Ventilation: Mask ventilation without difficulty LMA: LMA inserted LMA Size: 3.0 Number of attempts: 1 Placement Confirmation: positive ETCO2 and breath sounds checked- equal and bilateral Tube secured with: Tape Dental Injury: Teeth and Oropharynx as per pre-operative assessment

## 2021-11-18 ENCOUNTER — Encounter (HOSPITAL_BASED_OUTPATIENT_CLINIC_OR_DEPARTMENT_OTHER): Payer: Self-pay | Admitting: General Surgery

## 2021-11-18 NOTE — Anesthesia Postprocedure Evaluation (Signed)
Anesthesia Post Note  Patient: Patricia Mclaughlin  Procedure(s) Performed: RIGHT BREAST RADIOACTIVE SEED LOCALIZED LUMPECTOMY (Right: Breast)     Patient location during evaluation: PACU Anesthesia Type: General Level of consciousness: sedated and patient cooperative Pain management: pain level controlled Vital Signs Assessment: post-procedure vital signs reviewed and stable Respiratory status: spontaneous breathing Cardiovascular status: stable Anesthetic complications: no   No notable events documented.  Last Vitals:  Vitals:   11/17/21 1015 11/17/21 1057  BP: 132/63 (!) 164/66  Pulse: 75 64  Resp: 18 15  Temp: 36.5 C   SpO2: 94% 94%    Last Pain:  Vitals:   11/17/21 1057  TempSrc:   PainSc: 0-No pain                 Nolon Nations

## 2021-11-19 ENCOUNTER — Encounter (HOSPITAL_COMMUNITY): Payer: Self-pay

## 2021-11-19 NOTE — Progress Notes (Signed)
HEMATOLOGY-ONCOLOGY [place] VISIT PROGRESS NOTE  I connected with Patricia Mclaughlin on 11/27/2021 at  2:30 PM EDT by Mychart video conference and verified that I am speaking with the correct person using two identifiers.  I discussed the limitations, risks, security and privacy concerns of performing an evaluation and management service by Webex and the availability of in person appointments.  I also discussed with the patient that there may be a patient responsible charge related to this service. The patient expressed understanding and agreed to proceed.  Patient's Location: Home Physician Location: Clinic  CHIEF COMPLIANT: Right breast DCIS  INTERVAL HISTORY: Patricia Mclaughlin is a 88 y.o. female with above-mentioned history of DCIS involving the right breast. She presents to the clinic today for a My Chart follow-up.  Oncology History  Malignant neoplasm of lower-outer quadrant of right breast of female, estrogen receptor positive ([place])  09/29/2021 Initial Diagnosis   Screening mammogram detected pleomorphic calcifications LOQ right breast: 1.7 x 1.2 x 0.7 cm.  Right breast biopsy lower outer quadrant: Focal high-grade DCIS with apocrine features solid type without necrosis, focally suspicious for microinvasion, ER 90%, PR 70%   10/28/2021 Cancer Staging   Staging form: Breast, AJCC 8th Edition - Clinical: Stage 0 (cTis (DCIS), cN0, cM0, G3, ER+, PR+, HER2: Not Assessed) - Signed by Nicholas Lose, MD on 10/28/2021 Stage prefix: Initial diagnosis Histologic grading system: 3 grade system   11/27/2021 Cancer Staging   Staging form: Breast, AJCC 8th Edition - Pathologic: Stage IA (pT1b, pN0, cM0, G2, ER+, PR+, HER2+) - Signed by Nicholas Lose, MD on 11/27/2021 Stage prefix: Initial diagnosis Histologic grading system: 3 grade system     REVIEW OF SYSTEMS:   Constitutional: Denies fevers, chills or abnormal weight loss Eyes: Denies blurriness of vision Ears, nose, mouth, throat,  and face: Denies mucositis or sore throat Respiratory: Denies cough, dyspnea or wheezes Cardiovascular: Denies palpitation, chest discomfort Gastrointestinal:  Denies nausea, heartburn or change in bowel habits Skin: Denies abnormal skin rashes Lymphatics: Denies new lymphadenopathy or easy bruising Neurological:Denies numbness, tingling or new weaknesses Behavioral/Psych: Mood is stable, no new changes  Extremities: No lower extremity edema Breast: denies any pain or lumps or nodules in either breasts All other systems were reviewed with the patient and are negative.  Observations/Objective:  There were no vitals filed for this visit. There is no height or weight on file to calculate BMI.  I have reviewed the data as listed    Latest Ref Rng & Units 11/14/2021    9:30 AM  CMP  Glucose 70 - 99 mg/dL 117   BUN 8 - 23 mg/dL 17   Creatinine 0.44 - 1.00 mg/dL 0.77   Sodium 135 - 145 mmol/L 140   Potassium 3.5 - 5.1 mmol/L 4.1   Chloride 98 - 111 mmol/L 105   CO2 22 - 32 mmol/L 30   Calcium 8.9 - 10.3 mg/dL 10.5     No results found for: "WBC", "HGB", "HCT", "MCV", "PLT", "NEUTROABS"    Assessment Plan:  Malignant neoplasm of lower-outer quadrant of right breast of female, estrogen receptor positive ([place]) 11/17/2021: Right lumpectomy: Grade 2 IDC with apocrine differentiation and micropapillary features 1 cm with associated high-grade DCIS, margins negative, ER 90%, PR 80%, Ki-67 2%, HER2 2+, FISH positive ratio 2.27 copy #5.1  Pathology counseling: I discussed the final pathology report of the patient provided  a copy of this report. I discussed the margins as well as lymph node surgeries. We also discussed  the final staging along with previously performed ER/PR and HER-2/neu testing.  Treatment plan: Given her advanced age I did not recommend antiestrogen therapy or anti-HER2 therapy Return to clinic on an as-needed basis. She plans to get mammograms annually as long as she has  good performance status.    I discussed the assessment and treatment plan with the patient. The patient was provided an opportunity to ask questions and all were answered. The patient agreed with the plan and demonstrated an understanding of the instructions. The patient was advised to call back or seek an in-person evaluation if the symptoms worsen or if the condition fails to improve as anticipated.   I provided 30 minutes of face-to-face Web Ex time during this encounter.    Rulon Eisenmenger, MD 11/27/2021  I Gardiner Coins am scribing for Dr. Lindi Adie  I have reviewed the above documentation for accuracy and completeness, and I agree with the above.

## 2021-11-21 ENCOUNTER — Encounter: Payer: Self-pay | Admitting: *Deleted

## 2021-11-21 LAB — SURGICAL PATHOLOGY

## 2021-11-27 ENCOUNTER — Encounter: Payer: Self-pay | Admitting: *Deleted

## 2021-11-27 ENCOUNTER — Inpatient Hospital Stay: Payer: Medicare Other | Attending: Hematology and Oncology | Admitting: Hematology and Oncology

## 2021-11-27 DIAGNOSIS — Z170 Estrogen receptor positive status [ER+]: Secondary | ICD-10-CM | POA: Insufficient documentation

## 2021-11-27 DIAGNOSIS — D0511 Intraductal carcinoma in situ of right breast: Secondary | ICD-10-CM

## 2021-11-27 DIAGNOSIS — C50511 Malignant neoplasm of lower-outer quadrant of right female breast: Secondary | ICD-10-CM | POA: Diagnosis not present

## 2021-11-27 NOTE — Assessment & Plan Note (Addendum)
11/17/2021: Right lumpectomy: Grade 2 IDC with apocrine differentiation and micropapillary features 1 cm with associated high-grade DCIS, margins negative, ER 90%, PR 80%, Ki-67 2%, HER2 2+, FISH positive ratio 2.27 copy #5.1  Pathology counseling: I discussed the final pathology report of the patient provided  a copy of this report. I discussed the margins as well as lymph node surgeries. We also discussed the final staging along with previously performed ER/PR and HER-2/neu testing.  Treatment plan: Given her advanced age I did not recommend antiestrogen therapy or anti-HER2 therapy Return to clinic on an as-needed basis. No role of mammograms given her age.

## 2021-12-03 IMAGING — MG DIGITAL DIAGNOSTIC UNILAT RIGHT W/ CAD
4 series · 4 of 4 positions shown · non-contrast
Comparison: Previous exam(s).

CLINICAL DATA: Screening recall for right breast calcifications.

EXAM:
DIGITAL DIAGNOSTIC RIGHT MAMMOGRAM WITH CAD

[R CC (1 of 2)]
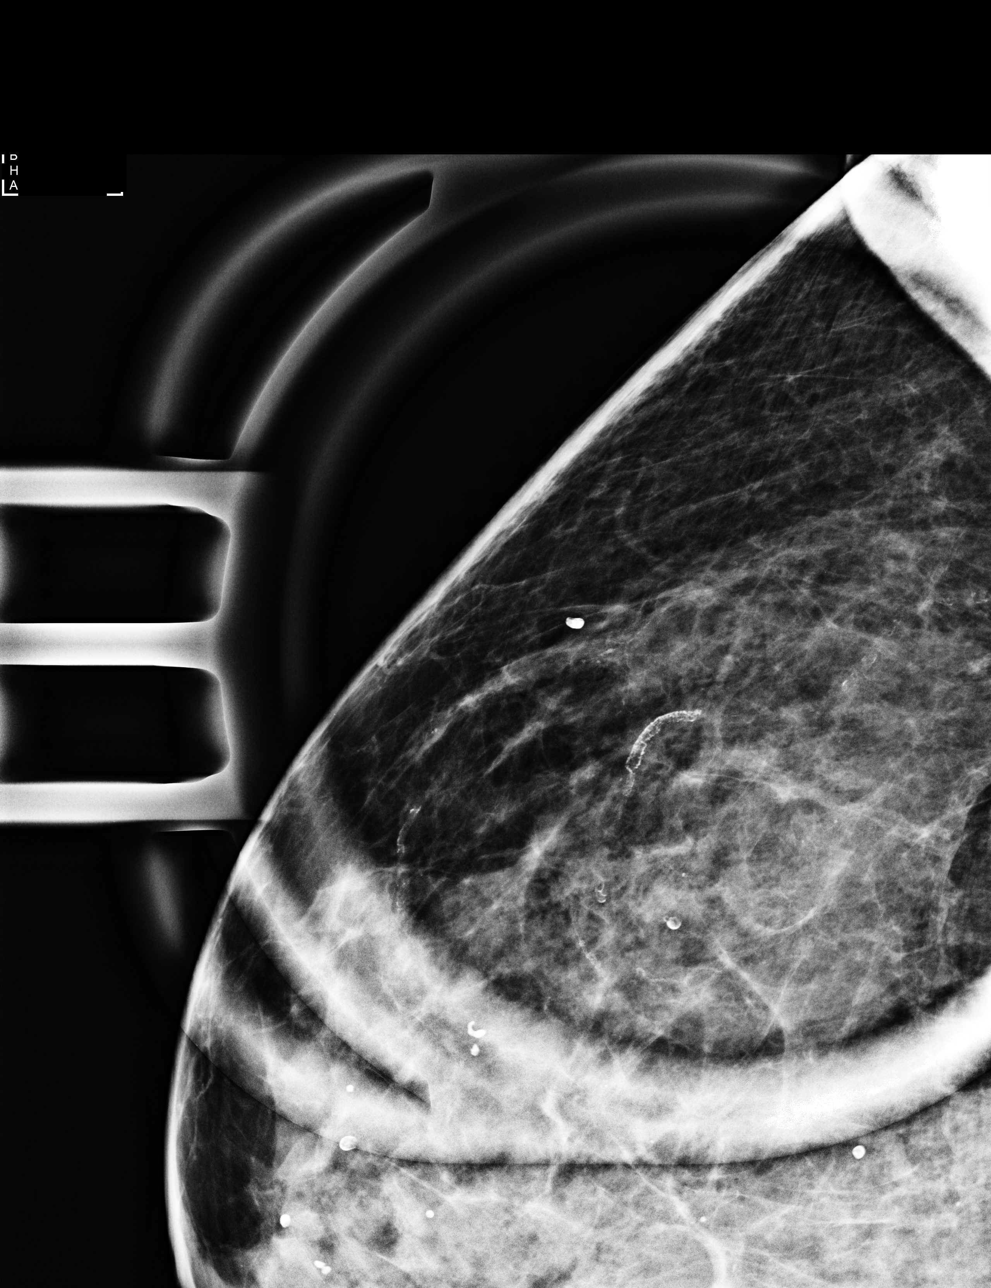

[R CC (2 of 2)]
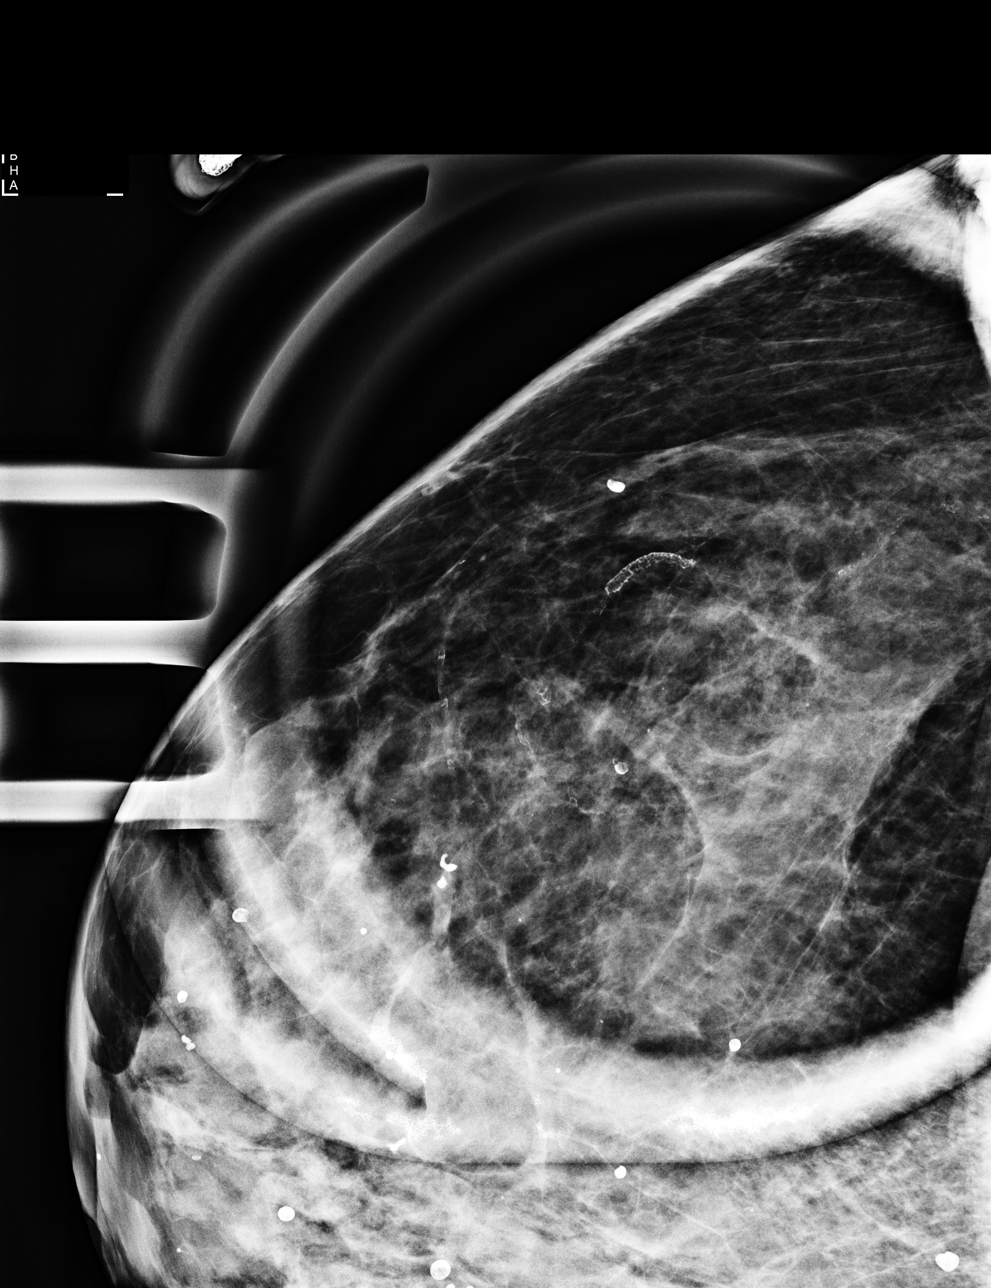

[R ML (1 of 2)]
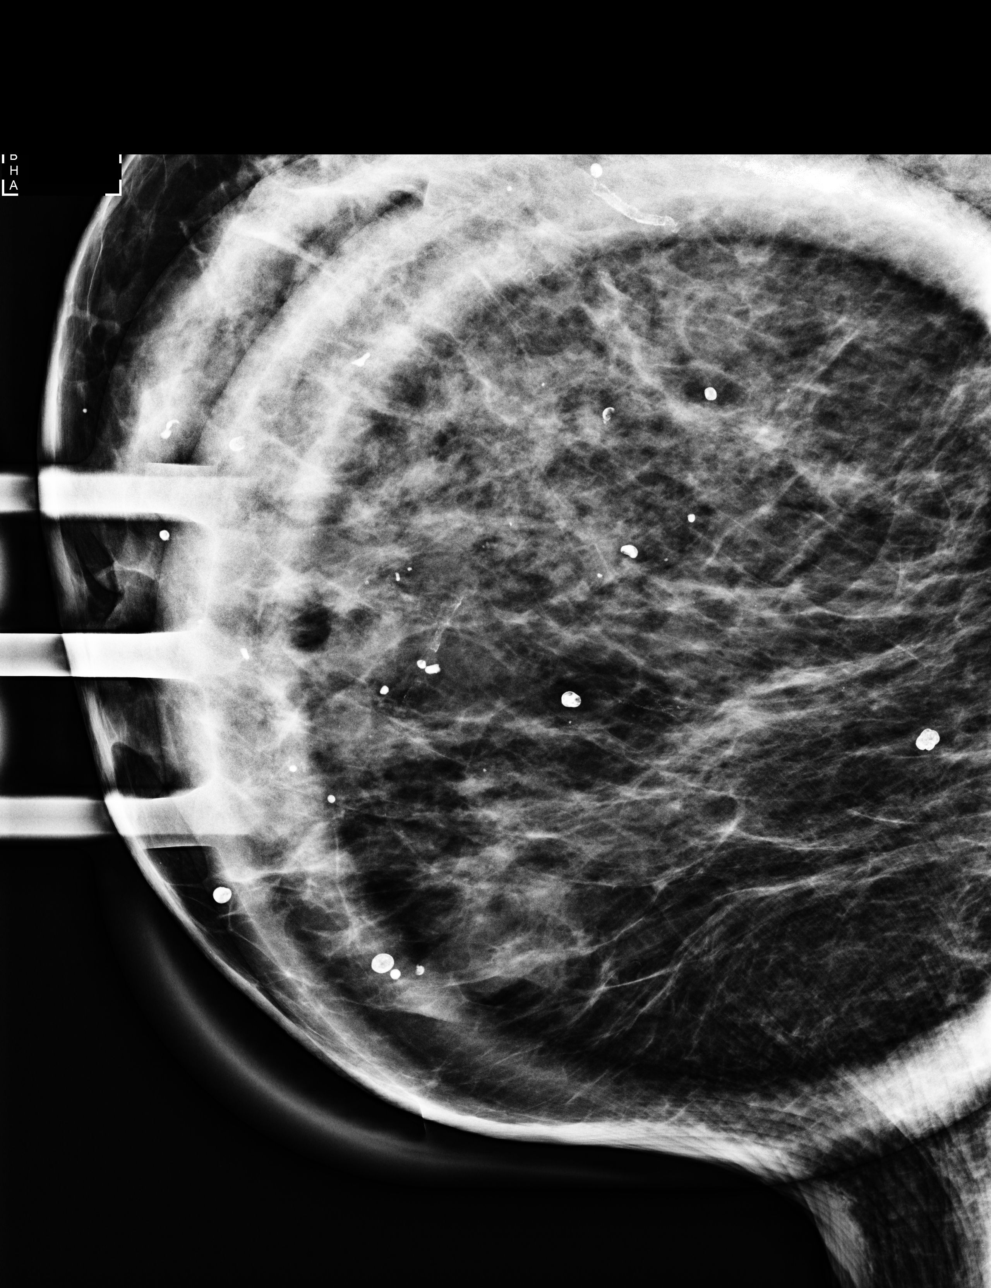

[R ML (2 of 2)]
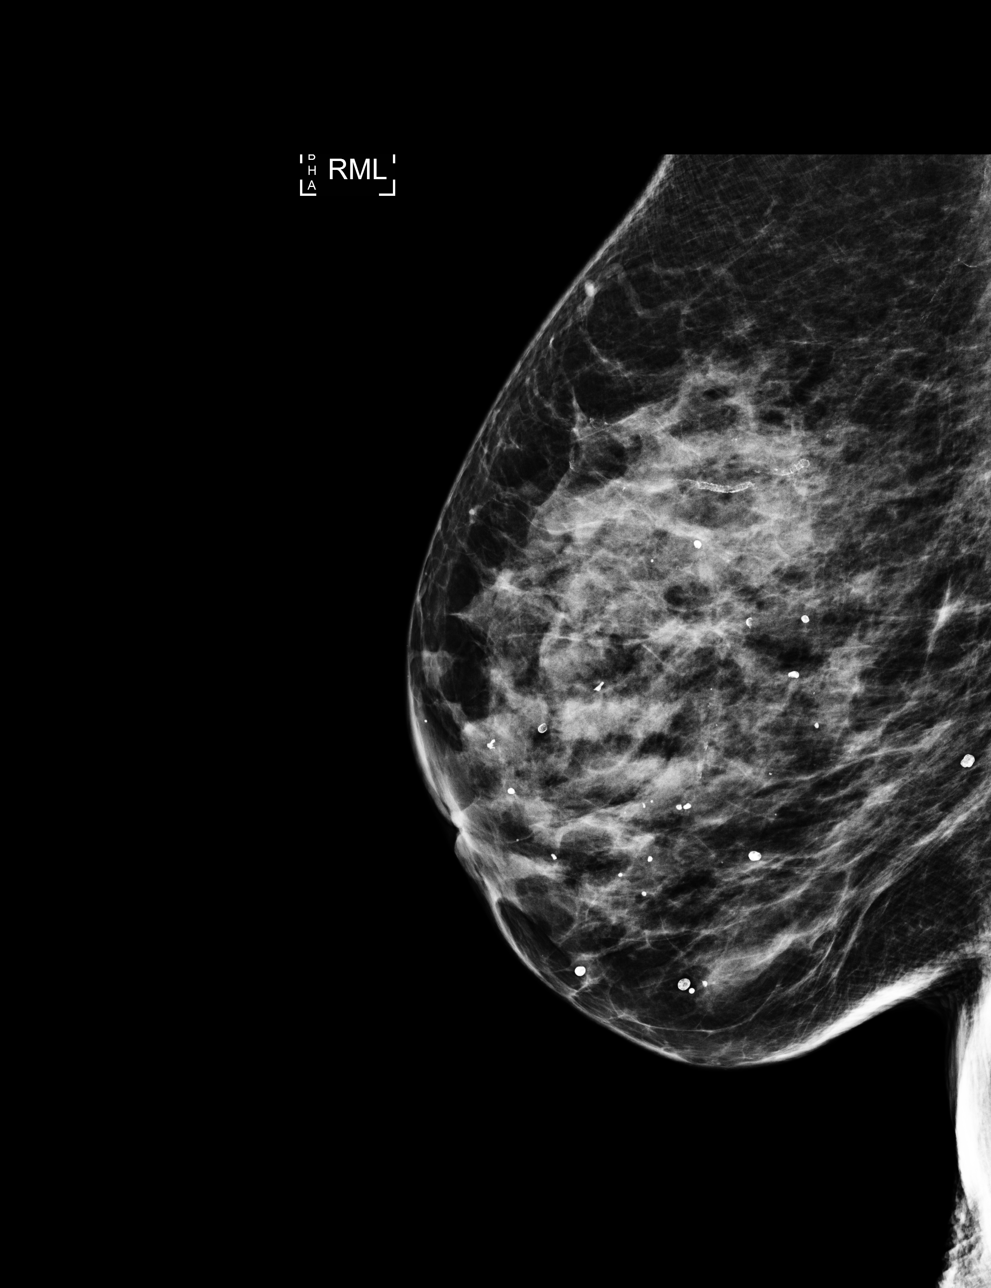

[4 of 4 positions shown; findings below may reference images not displayed]

ACR Breast Density Category c: The breast tissue is heterogeneously
dense, which may obscure small masses.
FINDINGS: In the lower outer quadrant of the right breast, posterior depth
there is a 1.0 cm group of faint linear calcifications. The patient
does have scattered areas of clear vascular calcifications
throughout both breasts, and these are favored to represent an early
manifestation of more of the same process.

Mammographic images were processed with CAD.
IMPRESSION: The 1.0 cm group of calcifications in the lower outer quadrant of
the right breast are favored to be benign vascular calcifications.

RECOMMENDATION:
Six-month follow-up diagnostic right breast mammogram.

I have discussed the findings and recommendations with the patient.
If applicable, a reminder letter will be sent to the patient
regarding the next appointment.

BI-RADS CATEGORY  3: Probably benign.

## 2021-12-10 ENCOUNTER — Ambulatory Visit (HOSPITAL_COMMUNITY)
Admission: RE | Admit: 2021-12-10 | Discharge: 2021-12-10 | Disposition: A | Discharge status: Home / Self Care | Payer: Medicare Other | Source: Ambulatory Visit | Attending: Physician Assistant | Admitting: Physician Assistant

## 2021-12-10 ENCOUNTER — Encounter (HOSPITAL_COMMUNITY): Payer: Self-pay

## 2021-12-10 VITALS — BP 126/55 | HR 58 | Temp 98.6°F | Resp 18

## 2021-12-10 DIAGNOSIS — G4489 Other headache syndrome: Secondary | ICD-10-CM | POA: Insufficient documentation

## 2021-12-10 DIAGNOSIS — U071 COVID-19: Secondary | ICD-10-CM | POA: Diagnosis present

## 2021-12-10 DIAGNOSIS — R051 Acute cough: Secondary | ICD-10-CM | POA: Diagnosis present

## 2021-12-10 NOTE — ED Provider Notes (Signed)
[facility]    CSN: [id number] Arrival date & time: 12/10/21  1246      History   Chief Complaint Chief Complaint  Patient presents with   Covid Positive    HPI Patricia Mclaughlin is a 88 y.o. female.   88 year old female presents for a COVID test and COVID symptoms.  Patient indicates that on Friday she had a family reunion where her relatives came down from [place] and they were all in close contact.  Patient indicates that she started having mild cough, congestion, and nausea with headache on Sunday.  Patient indicates that yesterday her daughters did a COVID test and it was negative, and this morning and they did 2 separate COVID test by 2 separate companies and both of these were positive this morning.  Patient continues to have mild symptoms of headache, cough, congestion or rhinitis.  She denies fever or chills, she has had nausea without vomiting.  She does relate being vaccinated against COVID, and that she has had a COVID before.  She has taken Paxlovid before without problems or side effects.     Past Medical History:  Diagnosis Date   DCIS (ductal carcinoma in situ) of breast    High cholesterol    Hypertension    Insomnia     Patient Active Problem List   Diagnosis Date Noted   Malignant neoplasm of lower-outer quadrant of right breast of female, estrogen receptor positive ([facility]) 10/16/2021   HYPERLIPIDEMIA 06/17/2006   HYPERTENSION, BENIGN SYSTEMIC 06/17/2006   INSOMNIA NOS 06/17/2006    Past Surgical History:  Procedure Laterality Date   ABDOMINAL HYSTERECTOMY     BREAST LUMPECTOMY WITH RADIOACTIVE SEED LOCALIZATION Right 11/17/2021   Procedure: RIGHT BREAST RADIOACTIVE SEED LOCALIZED LUMPECTOMY;  Surgeon: Jovita Kussmaul, MD;  Location: [facility];  Service: General;  Laterality: Right;   EYE SURGERY      OB History   No obstetric history on file.      Home Medications    Prior to Admission medications   Medication  Sig Start Date End Date Taking? Authorizing Provider  amLODipine (NORVASC) 2.5 MG tablet Take 1 tablet (2.5 mg total) by mouth daily. 08/27/21  Yes   atorvastatin (LIPITOR) 80 MG tablet Take 1/2 tablet (40 mg total) by mouth daily. 08/22/21  Yes   bisoprolol (ZEBETA) 10 MG tablet Take 1 tablet by mouth 2 (two) times daily.   Yes [provider]  Cyanocobalamin (VITAMIN B12) 1000 MCG TBCR Take 100 mcg by mouth daily.   Yes [provider]  fexofenadine (ALLEGRA) 180 MG tablet  08/22/21  Yes [provider]  hydrochlorothiazide (HYDRODIURIL) 25 MG tablet Take 25 mg by mouth daily.   Yes [provider]  magnesium oxide (MAG-OX) 400 MG tablet Take 1 tablet by mouth daily. 07/29/21  Yes [provider]  montelukast (SINGULAIR) 10 MG tablet Take 1 tablet (10 mg total) by mouth at bedtime. 09/27/21  Yes White, Adrienne R, NP  potassium chloride (KLOR-CON) 8 MEQ tablet Take 8 mEq by mouth in the morning and at bedtime. 07/09/21  Yes [provider]  zolpidem (AMBIEN) 5 MG tablet Take 2.5 mg by mouth at bedtime. 05/27/21  Yes [provider]  oxyCODONE (ROXICODONE) 5 MG immediate release tablet Take 1 tablet (5 mg total) by mouth every 6 (six) hours as needed for severe pain. 11/17/21   Jovita Kussmaul, MD    Family History Family History  Problem Relation  Age of Onset   Breast cancer Mother     Social History Social History   Tobacco Use   Smoking status: Never   Smokeless tobacco: Never  Vaping Use   Vaping Use: Never used  Substance Use Topics   Alcohol use: Never   Drug use: Never     Allergies   Ace inhibitors   Review of Systems Review of Systems  Constitutional:  Positive for fatigue.  HENT:  Positive for postnasal drip and rhinorrhea.   Neurological:  Positive for headaches.     Physical Exam Triage Vital Signs ED Triage Vitals  Enc Vitals Group     BP 12/10/21 1309 (!) 126/55     Pulse Rate 12/10/21 1309 (!) 58      Resp 12/10/21 1309 18     Temp 12/10/21 1309 98.6 F (37 C)     Temp Source 12/10/21 1309 Oral     SpO2 12/10/21 1309 96 %     Weight --      Height --      Head Circumference --      Peak Flow --      Pain Score 12/10/21 1304 0     Pain Loc --      Pain Edu? --      Excl. in [place]? --    No data found.  Updated Vital Signs BP (!) 126/55 (BP Location: Right Arm)   Pulse (!) 58   Temp 98.6 F (37 C) (Oral)   Resp 18   SpO2 96%   Visual Acuity Right Eye Distance:   Left Eye Distance:   Bilateral Distance:    Right Eye Near:   Left Eye Near:    Bilateral Near:     Physical Exam Constitutional:      Appearance: Normal appearance.  HENT:     Right Ear: Tympanic membrane and ear canal normal.     Left Ear: Tympanic membrane and ear canal normal.     Mouth/Throat:     Mouth: Mucous membranes are moist.     Pharynx: Oropharynx is clear. No pharyngeal swelling or posterior oropharyngeal erythema.  Cardiovascular:     Rate and Rhythm: Normal rate. Rhythm irregular.     Heart sounds: Normal heart sounds.  Pulmonary:     Effort: Pulmonary effort is normal.     Breath sounds: Normal breath sounds and air entry. No wheezing, rhonchi or rales.  Lymphadenopathy:     Cervical: No cervical adenopathy.  Neurological:     Mental Status: She is alert.      UC Treatments / Results  Labs (all labs ordered are listed, but only abnormal results are displayed) Labs Reviewed  SARS CORONAVIRUS 2 (TAT 6-24 HRS)    EKG   Radiology No results found.  Procedures Procedures (including critical care time)  Medications Ordered in UC Medications - No data to display  Initial Impression / Assessment and Plan / UC Course  I have reviewed the triage vital signs and the nursing notes.  Pertinent labs & imaging results that were available during my care of the patient were reviewed by me and considered in my medical decision making (see chart for details).    Plan: 1.  COVID  test is pending.  (Advise for Paxlovid to be sent to the pharmacy if the test results are positive) 2.  Advised to treat the symptoms, Tylenol or ibuprofen for headache. 3.  Advised to follow-up with PCP or return to urgent  care if symptoms fail to improve. Final Clinical Impressions(s) / UC Diagnoses   Final diagnoses:  COVID  Acute cough  Other headache syndrome     Discharge Instructions      COVID test will take 24 hours or less to be complete.  If you do not get a call from this office that indicates that the test is negative.  You can go on MyChart to review the results in 24 hours. If the COVID test is positive then you have the option to take Paxlovid and have it sent to the pharmacy to treat the virus.    ED Prescriptions   None    PDMP not reviewed this encounter.   Nyoka Lint, PA-C 12/10/21 1333

## 2021-12-10 NOTE — Discharge Instructions (Addendum)
COVID test will take 24 hours or less to be complete.  If you do not get a call from this office that indicates that the test is negative.  You can go on MyChart to review the results in 24 hours. If the COVID test is positive then you have the option to take Paxlovid and have it sent to the pharmacy to treat the virus.

## 2021-12-10 NOTE — ED Triage Notes (Signed)
Patient states that she had a headache, nausea, cough started Monday and the exposure was Friday. She has taken tylenol and IBU with some relief. Tested yesterday neg and today was positive X 2.

## 2021-12-11 ENCOUNTER — Telehealth (HOSPITAL_COMMUNITY): Payer: Self-pay | Admitting: Emergency Medicine

## 2021-12-11 LAB — SARS CORONAVIRUS 2 (TAT 6-24 HRS): SARS Coronavirus 2: POSITIVE — AB

## 2021-12-11 MED ORDER — NIRMATRELVIR/RITONAVIR (PAXLOVID)TABLET: 3.0000 | ORAL_TABLET | Freq: Two times a day (BID) | ORAL | 0 refills | Status: AC

## 2021-12-11 MED ORDER — NIRMATRELVIR/RITONAVIR (PAXLOVID)TABLET: 3.0000 | ORAL_TABLET | Freq: Two times a day (BID) | ORAL | 0 refills | Status: DC

## 2021-12-26 ENCOUNTER — Telehealth: Payer: Self-pay | Admitting: Adult Health

## 2021-12-26 NOTE — Telephone Encounter (Signed)
Per 9/7 in basket, message has been left

## 2022-01-27 ENCOUNTER — Inpatient Hospital Stay: Payer: Medicare Other | Admitting: Adult Health

## 2022-02-08 NOTE — Progress Notes (Unsigned)
SURVIVORSHIP VIRTUAL VISIT:  I connected with Patricia Mclaughlin on 02/08/22 at  9:15 AM EDT by Telephone and verified that I am speaking with the correct person using two identifiers.  I discussed the limitations, risks, security and privacy concerns of performing an evaluation and management service by telephone and the availability of in person appointments. I also discussed with the patient that there may be a patient responsible charge related to this service. The patient expressed understanding and agreed to proceed.   Patient location: home Provider location: [facility] office  BRIEF ONCOLOGIC HISTORY:  Oncology History  Malignant neoplasm of lower-outer quadrant of right breast of female, estrogen receptor positive ([facility])  09/29/2021 Initial Diagnosis   Screening mammogram detected pleomorphic calcifications LOQ right breast: 1.7 x 1.2 x 0.7 cm.  Right breast biopsy lower outer quadrant: Focal high-grade DCIS with apocrine features solid type without necrosis, focally suspicious for microinvasion, ER 90%, PR 70%   10/28/2021 Cancer Staging   Staging form: Breast, AJCC 8th Edition - Clinical: Stage 0 (cTis (DCIS), cN0, cM0, G3, ER+, PR+, HER2: Not Assessed) - Signed by Nicholas Lose, MD on 10/28/2021 Stage prefix: Initial diagnosis Histologic grading system: 3 grade system   11/17/2021 Surgery   A. BREAST, RIGHT LATERAL MARGIN, EXCISION:  -  Benign breast tissue.   B. BREAST, RIGHT, LUMPECTOMY:  -  Invasive ductal carcinoma with apocrine differentiation and  micropapillary features, Nottingham histologic score 2 of 3 (tubule  score 3/3; nuclear pleomorphism 2/3; mitotic index 1/3).  -  10 x 5 x 5 mm  -  Associated adjacent solid and apocrine type DCIS, nuclear grade 3/3.  -  Margins negative (closest margin posterior at 5 mm; all others  greater than 10 mm)  pT1c pNX pM n/a    11/27/2021 Cancer Staging   Staging form: Breast, AJCC 8th Edition - Pathologic: Stage IA (pT1b, pN0, cM0, G2, ER+,  PR+, HER2+) - Signed by Nicholas Lose, MD on 11/27/2021 Stage prefix: Initial diagnosis Histologic grading system: 3 grade system     INTERVAL HISTORY:  Patricia Mclaughlin to review her survivorship care plan detailing her treatment course for breast cancer, as well as monitoring long-term side effects of that treatment, education regarding health maintenance, screening, and overall wellness and health promotion.     Overall, Patricia Mclaughlin reports feeling quite well.  She is healing well since completing her surgery.  She remains active and continues to exercise regularly.  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    REVIEW OF SYSTEMS:  Review of Systems  Constitutional:  Negative for appetite change, chills, fatigue, fever and unexpected weight change.  HENT:   Negative for hearing loss, lump/mass and trouble swallowing.   Eyes:  Negative for eye problems and icterus.  Respiratory:  Negative for chest tightness, cough and shortness of breath.   Cardiovascular:  Negative for chest pain, leg swelling and palpitations.  Gastrointestinal:  Negative for abdominal distention, abdominal pain, constipation, diarrhea, nausea and vomiting.  Endocrine: Negative for hot flashes.  Genitourinary:  Negative for difficulty urinating.   Musculoskeletal:  Negative for arthralgias.  Skin:  Negative for itching and rash.  Neurological:  Negative for dizziness, extremity weakness, headaches and numbness.  Hematological:  Negative for adenopathy. Does not bruise/bleed easily.  Psychiatric/Behavioral:  Negative for depression. The patient is not nervous/anxious.   Breast: Denies any new nodularity,  masses, tenderness, nipple changes, or nipple discharge.      ONCOLOGY TREATMENT TEAM:  1. Surgeon:  Dr. Marlou Starks at [facility] 2. Medical Oncologist: Dr. Lindi Adie  3. Radiation Oncologist: Dr.  Lisbeth Renshaw    PAST MEDICAL/SURGICAL HISTORY:  Past Medical History:  Diagnosis Date   DCIS (ductal carcinoma in situ) of breast    High cholesterol    Hypertension    Insomnia    Past Surgical History:  Procedure Laterality Date   ABDOMINAL HYSTERECTOMY     BREAST LUMPECTOMY WITH RADIOACTIVE SEED LOCALIZATION Right 11/17/2021   Procedure: RIGHT BREAST RADIOACTIVE SEED LOCALIZED LUMPECTOMY;  Surgeon: Jovita Kussmaul, MD;  Location: [facility];  Service: General;  Laterality: Right;   EYE SURGERY       ALLERGIES:  Allergies  Allergen Reactions   Ace Inhibitors Other (See Comments)     CURRENT MEDICATIONS:  Outpatient Encounter Medications as of 02/09/2022  Medication Sig   amLODipine (NORVASC) 2.5 MG tablet Take 1 tablet (2.5 mg total) by mouth daily.   atorvastatin (LIPITOR) 80 MG tablet Take 1/2 tablet (40 mg total) by mouth daily.   bisoprolol (ZEBETA) 10 MG tablet Take 1 tablet by mouth 2 (two) times daily.   Cyanocobalamin (VITAMIN B12) 1000 MCG TBCR Take 100 mcg by mouth daily.   fexofenadine (ALLEGRA) 180 MG tablet    hydrochlorothiazide (HYDRODIURIL) 25 MG tablet Take 25 mg by mouth daily.   magnesium oxide (MAG-OX) 400 MG tablet Take 1 tablet by mouth daily.   montelukast (SINGULAIR) 10 MG tablet Take 1 tablet (10 mg total) by mouth at bedtime.   oxyCODONE (ROXICODONE) 5 MG immediate release tablet Take 1 tablet (5 mg total) by mouth every 6 (six) hours as needed for severe pain.   potassium chloride (KLOR-CON) 8 MEQ tablet Take 8 mEq by mouth in the morning and at bedtime.   zolpidem (AMBIEN) 5 MG tablet Take 2.5 mg by mouth at bedtime.   No facility-administered encounter medications on file as of 02/09/2022.     ONCOLOGIC FAMILY HISTORY:  Family History  Problem Relation Age of Onset   Breast cancer Mother        SOCIAL HISTORY:  Social History   Socioeconomic History   Marital status: Married    Spouse name: Not on file   Number of  children: Not on file   Years of education: Not on file   Highest education level: Not on file  Occupational History   Not on file  Tobacco Use   Smoking status: Never   Smokeless tobacco: Never  Vaping Use   Vaping Use: Never used  Substance and Sexual Activity   Alcohol use: Never   Drug use: Never   Sexual activity: Not on file  Other Topics Concern   Not on file  Social History Narrative   Not on file   Social Determinants of Health   Financial Resource Strain: Not on file  Food Insecurity: Not on file  Transportation Needs: Not on file  Physical Activity: Not on file  Stress: Not on file  Social Connections: Not on file  Intimate Partner Violence: Not on file     OBSERVATIONS/OBJECTIVE:  Patient sounds well, she is in no apparent distress.  Mood and behavior are normal, speech is normal, breathing non-labored  LABORATORY DATA:  None for this visit.  DIAGNOSTIC IMAGING:  None for this visit.      ASSESSMENT AND PLAN:  Patricia Mclaughlin is a pleasant 88  y.o. female with Stage IA right breast invasive ductal carcinoma, ER+/PR+/HER2-, diagnosed in 10/2021, treated with lumpectomy.  She presents to the Survivorship Clinic for our initial meeting and routine follow-up post-completion of treatment for breast cancer.    1. Stage IA right breast cancer:  Patricia Mclaughlin is continuing to recover from definitive treatment for breast cancer. She will follow-up with her medical oncologist, Dr. Lindi Adie in [place] with history and physical exam per surveillance protocol.  Her mammogram is due 09/2022; orders placed today. Today, a comprehensive survivorship care plan and treatment summary was reviewed with the patient today detailing her breast cancer diagnosis, treatment course, potential late/long-term effects of treatment, appropriate follow-up care with recommendations for the future, and patient education resources.  A copy of this summary, along with a letter will be sent to the patient's  primary care provider via mail/fax/In Basket message after today's visit.    2. Bone health:  She was given education on specific activities to promote bone health.  3. Cancer screening:  Due to Patricia Mclaughlin history and her age, she should receive screening for skin cancers.  The information and recommendations are listed on the patient's comprehensive care plan/treatment summary and were reviewed in detail with the patient.    4. Health maintenance and wellness promotion: Patricia Mclaughlin was encouraged to consume 5-7 servings of fruits and vegetables per day. We reviewed the "Nutrition Rainbow" handout  She was also encouraged to engage in exercise as she is able. She was instructed to limit her alcohol consumption and continue to abstain from tobacco use.     5. Support services/counseling: It is not uncommon for this period of the patient's cancer care trajectory to be one of many emotions and stressors.  She was given information regarding our available services and encouraged to contact me with any questions or for help enrolling in any of our support group/programs.    Follow up instructions:    -Return to cancer center [facility] for f/u with Dr. Lindi Adie  -Mammogram due in 09/2022 -She is welcome to return back to the Survivorship Clinic at any time; no additional follow-up needed at this time.  -Consider referral back to survivorship as a long-term survivor for continued surveillance  The patient was provided an opportunity to ask questions and all were answered. The patient agreed with the plan and demonstrated an understanding of the instructions.   The patient was advised to call back or seek an in-person evaluation if the symptoms worsen or if the condition fails to improve as anticipated.   I provided 15 minutes of non face-to-face telephone visit time during this encounter, and > 50% was spent counseling as documented under my assessment & plan.  Wilber Bihari, NP 02/08/22 1:53 PM Medical  Oncology and Hematology [facility] [place], [place] [address] Tel. [phone]    Fax. [phone]

## 2022-02-09 ENCOUNTER — Encounter: Payer: Self-pay | Admitting: Adult Health

## 2022-02-09 ENCOUNTER — Inpatient Hospital Stay: Payer: Medicare Other | Attending: Hematology and Oncology | Admitting: Adult Health

## 2022-02-09 DIAGNOSIS — C50511 Malignant neoplasm of lower-outer quadrant of right female breast: Secondary | ICD-10-CM | POA: Diagnosis not present

## 2022-02-09 DIAGNOSIS — Z6835 Body mass index (BMI) 35.0-35.9, adult: Secondary | ICD-10-CM | POA: Insufficient documentation

## 2022-02-09 DIAGNOSIS — Z170 Estrogen receptor positive status [ER+]: Secondary | ICD-10-CM

## 2022-06-09 ENCOUNTER — Other Ambulatory Visit (HOSPITAL_COMMUNITY): Payer: Self-pay

## 2022-06-09 ENCOUNTER — Ambulatory Visit (INDEPENDENT_AMBULATORY_CARE_PROVIDER_SITE_OTHER): Payer: Medicare Other | Admitting: Podiatry

## 2022-06-09 DIAGNOSIS — M79674 Pain in right toe(s): Secondary | ICD-10-CM | POA: Diagnosis not present

## 2022-06-09 DIAGNOSIS — M79675 Pain in left toe(s): Secondary | ICD-10-CM

## 2022-06-09 DIAGNOSIS — L97511 Non-pressure chronic ulcer of other part of right foot limited to breakdown of skin: Secondary | ICD-10-CM | POA: Diagnosis not present

## 2022-06-09 DIAGNOSIS — B351 Tinea unguium: Secondary | ICD-10-CM | POA: Diagnosis not present

## 2022-06-09 MED ORDER — MUPIROCIN 2 % EX OINT
1.0000 | TOPICAL_OINTMENT | Freq: Two times a day (BID) | CUTANEOUS | 2 refills | Status: AC
  Filled 2022-06-09: qty 44, 22d supply, fill #0

## 2022-06-09 NOTE — Patient Instructions (Signed)
If the wounds on the big toe are not healed in the next 2 weeks, let me know. Apply a small amount of antibiotic ointment (mupirocin) to the wounds daily.   Monitor for any signs/symptoms of infection. Call the office immediately if any occur or go directly to the emergency room. Call with any questions/concerns.

## 2022-06-16 NOTE — Progress Notes (Signed)
Subjective:   Patient ID: Patricia Mclaughlin, female   DOB: 89 y.o.   MRN: [id number]   HPI Chief Complaint  Patient presents with   Ingrown Toenail    Bilateral great ingrown x 6 months. Pt states the nail thickness are causing redness and soreness to her toes. Pt states she got a new pair of shoes that caused a sore on her right great toe. Pt states she stopped wearing the shoes. [place] home on Saturday.    89 year old female presents the office today with above concerns.  She states that her nails are thickened discolored and causing soreness.  She is not able to trim them herself.  She did get new pair shoes that cause a sore in her right big toe.  She still has not stopped wearing the shoes.  Denies any drainage or pus.  No fevers or chills that she reports.  She has no other concerns today.   Review of Systems  All other systems reviewed and are negative.  Past Medical History:  Diagnosis Date   DCIS (ductal carcinoma in situ) of breast    High cholesterol    Hypertension    Insomnia     Past Surgical History:  Procedure Laterality Date   ABDOMINAL HYSTERECTOMY     BREAST LUMPECTOMY WITH RADIOACTIVE SEED LOCALIZATION Right 11/17/2021   Procedure: RIGHT BREAST RADIOACTIVE SEED LOCALIZED LUMPECTOMY;  Surgeon: Jovita Kussmaul, MD;  Location: [facility];  Service: General;  Laterality: Right;   EYE SURGERY       Current Outpatient Medications:    mupirocin ointment (BACTROBAN) 2 %, Apply topically 2 times daily., Disp: 30 g, Rfl: 2   amLODipine (NORVASC) 2.5 MG tablet, Take 1 tablet (2.5 mg total) by mouth daily., Disp: 90 tablet, Rfl: 4   atorvastatin (LIPITOR) 80 MG tablet, Take 1/2 tablet (40 mg total) by mouth daily., Disp: 45 tablet, Rfl: 4   bisoprolol (ZEBETA) 10 MG tablet, Take 1 tablet by mouth 2 (two) times daily., Disp: , Rfl:    Cyanocobalamin (VITAMIN B12) 1000 MCG TBCR, Take 100 mcg by mouth daily., Disp: ,  Rfl:    fexofenadine (ALLEGRA) 180 MG tablet, , Disp: , Rfl:    hydrochlorothiazide (HYDRODIURIL) 25 MG tablet, Take 25 mg by mouth daily., Disp: , Rfl:    magnesium oxide (MAG-OX) 400 MG tablet, Take 1 tablet by mouth daily., Disp: , Rfl:    montelukast (SINGULAIR) 10 MG tablet, Take 1 tablet (10 mg total) by mouth at bedtime., Disp: 14 tablet, Rfl: 0   oxyCODONE (ROXICODONE) 5 MG immediate release tablet, Take 1 tablet (5 mg total) by mouth every 6 (six) hours as needed for severe pain., Disp: 10 tablet, Rfl: 0   potassium chloride (KLOR-CON) 8 MEQ tablet, Take 8 mEq by mouth in the morning and at bedtime., Disp: , Rfl:    zolpidem (AMBIEN) 5 MG tablet, Take 2.5 mg by mouth at bedtime., Disp: , Rfl:   Allergies  Allergen Reactions   Ace Inhibitors Other (See Comments)           Objective:  Physical Exam  General: AAO x3, NAD  Dermatological: Nails are hypertrophic, dystrophic, brittle, discolored, elongated 10. No surrounding redness or drainage. Tenderness nails 1-5 bilaterally.  Superficial area skin breakdown noted along the distal aspect of the hallux on right side worse than left.  There is no drainage or pus.  There is no fluctuance  or crepitation but there is malodor.  No ascending cellulitis.  No other open lesions.  Vascular: Dorsalis Pedis artery and Posterior Tibial artery pedal pulses are palpable bilateral with immedate capillary fill time.  There is no pain with calf compression, swelling, warmth, erythema.   Neruologic: Grossly intact via light touch bilateral.   Musculoskeletal: No other areas of discomfort noted.  Gait: Unassisted, Nonantalgic.       Assessment:   Symptomatic onychomycosis, superficial ulcerations bilateral hallux    Plan:  -Treatment options discussed including all alternatives, risks, and complications -Etiology of symptoms were discussed -Nails debrided 10 without complications or bleeding. -Recommend small amount of antibiotic  ointment to the toes daily.  Monitor for any signs or symptoms of infection.  If does not heal next 2 weeks to let me know or sooner if there is any worsening or changes.   -Daily foot inspection -Follow-up in 3 months or sooner if any problems arise. In the meantime, encouraged to call the office with any questions, concerns, change in symptoms.   Return in about 3 months (around 09/07/2022) for routine care.  Celesta Gentile, DPM

## 2022-08-31 ENCOUNTER — Encounter: Payer: Self-pay | Admitting: Podiatry

## 2022-08-31 ENCOUNTER — Ambulatory Visit (INDEPENDENT_AMBULATORY_CARE_PROVIDER_SITE_OTHER): Payer: Medicare Other | Admitting: Podiatry

## 2022-08-31 DIAGNOSIS — M79674 Pain in right toe(s): Secondary | ICD-10-CM

## 2022-08-31 DIAGNOSIS — B351 Tinea unguium: Secondary | ICD-10-CM | POA: Diagnosis not present

## 2022-08-31 DIAGNOSIS — M79675 Pain in left toe(s): Secondary | ICD-10-CM | POA: Diagnosis not present

## 2022-08-31 NOTE — Progress Notes (Signed)
Subjective: Chief Complaint  Patient presents with   Debridement    Trim toenails/check feet   89 year old female presents the office for above concerns.  No swelling redness or any drainage.  The wounds that she had previously have healed and she is very happy.  Objective: AAO x3, NAD DP/PT pulses palpable bilaterally, CRT less than 3 seconds Nails are hypertrophic, dystrophic, brittle, discolored, elongated 10. No surrounding redness or drainage. Tenderness nails 1-5 bilaterally.  Minimal callus right distal hallux, no ulcers b/l. No pain with calf compression, swelling, warmth, erythema  Assessment: Symptomatic onychomycosis  Plan: -All treatment options discussed with the patient including all alternatives, risks, complications.  -Sharply debrided the nails x 10 with any complications or bleeding. -As a courtesy and likely debrided the left right distal hallux where there was some loose skin from for callus which I debrided easily without any complications or bleeding.  No signs of ulceration or infection.  Offloading. -Patient encouraged to call the office with any questions, concerns, change in symptoms.   Vivi Barrack DPM

## 2022-09-07 ENCOUNTER — Ambulatory Visit: Payer: Medicare Other | Admitting: Podiatry

## 2022-09-29 ENCOUNTER — Ambulatory Visit
Admission: RE | Admit: 2022-09-29 | Discharge: 2022-09-29 | Disposition: A | Discharge status: Home / Self Care | Payer: Medicare Other | Source: Ambulatory Visit | Attending: Adult Health | Admitting: Adult Health

## 2022-09-29 DIAGNOSIS — Z170 Estrogen receptor positive status [ER+]: Secondary | ICD-10-CM

## 2022-11-30 ENCOUNTER — Ambulatory Visit: Payer: Medicare Other | Admitting: Podiatry

## 2022-11-30 DIAGNOSIS — M79674 Pain in right toe(s): Secondary | ICD-10-CM | POA: Diagnosis not present

## 2022-11-30 DIAGNOSIS — M79675 Pain in left toe(s): Secondary | ICD-10-CM

## 2022-11-30 DIAGNOSIS — B351 Tinea unguium: Secondary | ICD-10-CM

## 2022-11-30 NOTE — Progress Notes (Signed)
Subjective: Chief Complaint  Patient presents with   RFC    RFC    89 year old female presents the office for above concerns.  No swelling redness or any drainage.  She has no new concerns today.   Objective: AAO x3, NAD DP/PT pulses palpable bilaterally, CRT less than 3 seconds Nails are hypertrophic, dystrophic, brittle, discolored, elongated 10. No surrounding redness or drainage. Tenderness nails 1-5 bilaterally.  Minimal callus right distal hallux, no ulcers b/l. No pain with calf compression, swelling, warmth, erythema  Assessment: Symptomatic onychomycosis  Plan: -All treatment options discussed with the patient including all alternatives, risks, complications.  -Sharply debrided the nails x 10 with any complications or bleeding. -Daily foot inspection. -Patient encouraged to call the office with any questions, concerns, change in symptoms.   Vivi Barrack DPM

## 2023-03-02 ENCOUNTER — Ambulatory Visit: Payer: Medicare Other | Admitting: Podiatry

## 2023-03-16 ENCOUNTER — Ambulatory Visit: Payer: Medicare Other | Admitting: Podiatry

## 2023-04-26 ENCOUNTER — Ambulatory Visit: Payer: Medicare Other | Admitting: Podiatry

## 2023-08-16 ENCOUNTER — Other Ambulatory Visit: Payer: Self-pay | Admitting: Internal Medicine

## 2023-08-16 DIAGNOSIS — Z853 Personal history of malignant neoplasm of breast: Secondary | ICD-10-CM

## 2023-10-04 ENCOUNTER — Other Ambulatory Visit (HOSPITAL_COMMUNITY): Payer: Self-pay

## 2023-10-04 MED ORDER — PREDNISONE 5 MG (21) PO TBPK
ORAL_TABLET | ORAL | 0 refills | Status: DC
  Filled 2023-10-04: qty 21, 6d supply, fill #0

## 2023-10-04 MED ORDER — LIDOCAINE 5 % EX PTCH
1.0000 | MEDICATED_PATCH | Freq: Every day | CUTANEOUS | 0 refills | Status: DC
  Filled 2023-10-04 – 2023-10-05 (×3): qty 30, 30d supply, fill #0

## 2023-10-05 ENCOUNTER — Ambulatory Visit (INDEPENDENT_AMBULATORY_CARE_PROVIDER_SITE_OTHER): Admitting: Podiatry

## 2023-10-05 ENCOUNTER — Other Ambulatory Visit (HOSPITAL_COMMUNITY): Payer: Self-pay

## 2023-10-05 DIAGNOSIS — M79675 Pain in left toe(s): Secondary | ICD-10-CM

## 2023-10-05 DIAGNOSIS — M79674 Pain in right toe(s): Secondary | ICD-10-CM | POA: Diagnosis not present

## 2023-10-05 DIAGNOSIS — B351 Tinea unguium: Secondary | ICD-10-CM

## 2023-10-05 NOTE — Progress Notes (Signed)
 Subjective: Chief Complaint  Patient presents with   RFC    RM#60 RFC     89 year old female presents the office for above concerns.  No swelling redness or any drainage.  She has no new concerns today.   Objective: AAO x3, NAD- presents with daughter  DP/PT pulses palpable bilaterally, CRT less than 3 seconds Nails are hypertrophic, dystrophic, brittle, discolored, elongated 10. No surrounding redness or drainage. Tenderness nails 1-5 bilaterally.  Minimal callus right distal hallux, no ulcers b/l. No pain with calf compression, swelling, warmth, erythema  Assessment: Symptomatic onychomycosis  Plan: -All treatment options discussed with the patient including all alternatives, risks, complications.  -Sharply debrided the nails x 10 with any complications or bleeding. -Daily foot inspection. -Patient encouraged to call the office with any questions, concerns, change in symptoms.   Return in about 3 months (around 01/05/2024) for nail trim .  Charity Conch DPM

## 2023-10-06 ENCOUNTER — Other Ambulatory Visit (HOSPITAL_COMMUNITY): Payer: Self-pay

## 2023-10-06 ENCOUNTER — Ambulatory Visit
Admission: RE | Admit: 2023-10-06 | Discharge: 2023-10-06 | Disposition: A | Discharge status: Home / Self Care | Source: Ambulatory Visit | Attending: Internal Medicine | Admitting: Internal Medicine

## 2023-10-06 DIAGNOSIS — Z853 Personal history of malignant neoplasm of breast: Secondary | ICD-10-CM

## 2023-10-08 ENCOUNTER — Telehealth: Payer: Self-pay | Admitting: Internal Medicine

## 2023-10-08 NOTE — Telephone Encounter (Signed)
 Pt's daughter, Miller Allis, called asking if Dr. Vallarie Gauze would accept pt as a new pt? Miller Allis stated her husband, Dr. Ena Harries McDiarmid is current pt of Dr. Vallarie Gauze. Shelly states pt is healthy for her age, she just wants a provider closer to home. Call back # [phone]

## 2023-10-10 NOTE — Telephone Encounter (Signed)
Yes, please set up when possible.  Thanks.

## 2023-10-11 NOTE — Telephone Encounter (Signed)
 Patricia Mclaughlin just states how she appreciates you taking her on as a new patient and looks forward to seeing you.

## 2023-10-11 NOTE — Telephone Encounter (Signed)
 Greatly appreciate the message.

## 2023-10-18 ENCOUNTER — Encounter: Payer: Self-pay | Admitting: Family Medicine

## 2023-10-18 ENCOUNTER — Ambulatory Visit (INDEPENDENT_AMBULATORY_CARE_PROVIDER_SITE_OTHER): Admitting: Family Medicine

## 2023-10-18 VITALS — BP 124/74 | HR 56 | Temp 98.3°F | Ht <= 58 in | Wt 121.0 lb

## 2023-10-18 DIAGNOSIS — I10 Essential (primary) hypertension: Secondary | ICD-10-CM

## 2023-10-18 DIAGNOSIS — M549 Dorsalgia, unspecified: Secondary | ICD-10-CM | POA: Diagnosis not present

## 2023-10-18 DIAGNOSIS — C4492 Squamous cell carcinoma of skin, unspecified: Secondary | ICD-10-CM

## 2023-10-18 DIAGNOSIS — R42 Dizziness and giddiness: Secondary | ICD-10-CM

## 2023-10-18 DIAGNOSIS — Z7189 Other specified counseling: Secondary | ICD-10-CM

## 2023-10-18 LAB — BASIC METABOLIC PANEL WITH GFR
BUN: 16 mg/dL (ref 6–23)
CO2: 31 meq/L (ref 19–32)
Calcium: 11.1 mg/dL — ABNORMAL HIGH (ref 8.4–10.5)
Chloride: 106 meq/L (ref 96–112)
Creatinine, Ser: 0.72 mg/dL (ref 0.40–1.20)
GFR: 70.69 mL/min (ref >60.00)
Glucose, Bld: 110 mg/dL — ABNORMAL HIGH (ref 70–99)
Potassium: 4.3 meq/L (ref 3.5–5.1)
Sodium: 142 meq/L (ref 135–145)

## 2023-10-18 MED ORDER — LIDOCAINE 5 % EX PTCH: 1.0000 | MEDICATED_PATCH | Freq: Every day | CUTANEOUS | 1 refills | Status: DC | PRN

## 2023-10-18 NOTE — Patient Instructions (Addendum)
 Please ask for a record request from the ortho clinic- [facility], dermatology, and Dr. [facility]ri[facility].  Please request the last 5 years.    Take care.  Glad to see you.  Go to the lab on the way out.   If you have mychart we'll likely use that to update you.

## 2023-10-18 NOTE — Progress Notes (Unsigned)
 Taking 7.5mg  amlodipine  per day.    Off hydrochlorothiazide due to prev elevated calcium . No CP.  Not SOB.  Some prev BLE edema but not now.    She is off cinacalcet now.  She felt well prior to starting that but then noted vertigo.  She had mental fog on med.  Nausea on med.  Med stopped in the meantime.  Prev ER eval d/w pt.    Family stayed with patient.  She had back pain after that and was seen at ortho.  Took prednisone  and used lidoderm  patch.  She felt better in the meantime.    In PT current, had Epley done.  Vertigo sx better now.  She has PT f/u pending.  She has back clinic f/u pending.    She had SCC biopsy per derm.  She is going to see about topical tx instead of MOHS.    She had breast cancer surgery w/o chemo or radiation.    Meds, vitals, and allergies reviewed.   ROS: Per HPI unless specifically indicated in ROS section   GEN: nad, alert and oriented HEENT: mucous membranes moist, TM wnl.  NECK: supple w/o LA CV: rrr PULM: ctab, no inc wob ABD: soft, +bs EXT: no edema SKIN: well perfused.

## 2023-10-20 ENCOUNTER — Ambulatory Visit: Payer: Self-pay | Admitting: Family Medicine

## 2023-10-20 ENCOUNTER — Encounter: Payer: Self-pay | Admitting: Family Medicine

## 2023-10-20 DIAGNOSIS — Z7189 Other specified counseling: Secondary | ICD-10-CM | POA: Insufficient documentation

## 2023-10-20 DIAGNOSIS — M549 Dorsalgia, unspecified: Secondary | ICD-10-CM | POA: Insufficient documentation

## 2023-10-20 DIAGNOSIS — C4492 Squamous cell carcinoma of skin, unspecified: Secondary | ICD-10-CM | POA: Insufficient documentation

## 2023-10-20 DIAGNOSIS — R42 Dizziness and giddiness: Secondary | ICD-10-CM | POA: Insufficient documentation

## 2023-10-20 NOTE — Assessment & Plan Note (Signed)
 Daughter Rico designated if patient were incapacitated.

## 2023-10-20 NOTE — Assessment & Plan Note (Signed)
 Improved compared to prior.  Had Epley maneuver done.  She is going to follow-up PT.

## 2023-10-20 NOTE — Assessment & Plan Note (Signed)
 Requesting records.  Recheck labs pending.  Off Cinacalcet.  She did not tolerate that previously.  Off hydrochlorothiazide.

## 2023-10-20 NOTE — Assessment & Plan Note (Signed)
 Improved with Lidoderm  and prednisone .  Requesting records from orthopedics.

## 2023-10-20 NOTE — Assessment & Plan Note (Signed)
 Continue amlodipine  7.5 mg for now.  Continue bisoprolol for now.

## 2023-10-23 ENCOUNTER — Encounter: Payer: Self-pay | Admitting: Family Medicine

## 2023-10-26 ENCOUNTER — Telehealth: Payer: Self-pay | Admitting: *Deleted

## 2023-10-26 NOTE — Telephone Encounter (Signed)
 Dr. Cleatus placed an order for pt to get additional lab (Vit. D, PTH), please schedule non fasting lab appt with family. Can also get it done at [facility] lab if easier

## 2023-10-28 ENCOUNTER — Other Ambulatory Visit: Payer: Self-pay | Admitting: Family Medicine

## 2023-10-28 DIAGNOSIS — R42 Dizziness and giddiness: Secondary | ICD-10-CM

## 2023-11-01 ENCOUNTER — Other Ambulatory Visit (INDEPENDENT_AMBULATORY_CARE_PROVIDER_SITE_OTHER)

## 2023-11-01 ENCOUNTER — Other Ambulatory Visit (HOSPITAL_COMMUNITY): Payer: Self-pay

## 2023-11-01 LAB — VITAMIN D 25 HYDROXY (VIT D DEFICIENCY, FRACTURES): VITD: 35.3 ng/mL (ref 30.00–100.00)

## 2023-11-01 MED ORDER — FLUOROURACIL 5 % EX CREA
TOPICAL_CREAM | CUTANEOUS | 0 refills | Status: AC
  Filled 2023-11-01: qty 40, 90d supply, fill #0

## 2023-11-02 LAB — PTH, INTACT AND CALCIUM
Calcium: 10.8 mg/dL — ABNORMAL HIGH (ref 8.6–10.4)
PTH: 41 pg/mL (ref 16–77)

## 2023-11-03 ENCOUNTER — Ambulatory Visit: Payer: Self-pay | Admitting: Family Medicine

## 2023-11-05 ENCOUNTER — Other Ambulatory Visit: Payer: Self-pay | Admitting: Family Medicine

## 2023-11-05 DIAGNOSIS — R2689 Other abnormalities of gait and mobility: Secondary | ICD-10-CM

## 2023-11-08 NOTE — Telephone Encounter (Signed)
 Closing encounter. Lab results have been given to patient by Dr. Cleatus

## 2023-11-18 ENCOUNTER — Other Ambulatory Visit: Payer: Self-pay | Admitting: Family Medicine

## 2023-12-05 ENCOUNTER — Other Ambulatory Visit: Payer: Self-pay | Admitting: Family Medicine

## 2023-12-06 NOTE — Telephone Encounter (Signed)
 LOV: 10/18/23 NOV: nothing scheduled  Last Refill: zolpidem (AMBIEN) 5 MG tablet  05/27/2021

## 2023-12-22 ENCOUNTER — Telehealth: Payer: Self-pay | Admitting: Podiatry

## 2023-12-22 NOTE — Telephone Encounter (Signed)
 Called and spoke with patients daughter. She is going to speak with her mother and decide if the want to r/s with Dr. Gershon or be moved to A.[facility]als schedule/MW

## 2024-01-02 NOTE — Therapy (Signed)
 OUTPATIENT PHYSICAL THERAPY NEURO EVALUATION   Patient Name: Patricia Mclaughlin MRN: [id number] DOB:[birth year], 89 y.o., female Today's Date: 01/03/2024   PCP: Cleatus Arlyss RAMAN, MD REFERRING PROVIDER: Cleatus Arlyss RAMAN, MD  END OF SESSION:  PT End of Session - 01/03/24 0804     Visit Number 1    Number of Visits 4    Date for PT Re-Evaluation 04/02/24   due to delay in scheduling   Authorization Type MEDICARE    PT Start Time 0802    PT Stop Time 0842    PT Time Calculation (min) 40 min    Equipment Utilized During Treatment Gait belt    Activity Tolerance Patient tolerated treatment well    Behavior During Therapy WFL for tasks assessed/performed          Past Medical History:  Diagnosis Date   DCIS (ductal carcinoma in situ) of breast    High cholesterol    Hypercalcemia    Hypertension    Insomnia    SCC (squamous cell carcinoma)    Past Surgical History:  Procedure Laterality Date   ABDOMINAL HYSTERECTOMY     BREAST LUMPECTOMY WITH RADIOACTIVE SEED LOCALIZATION Right 11/17/2021   Procedure: RIGHT BREAST RADIOACTIVE SEED LOCALIZED LUMPECTOMY;  Surgeon: Curvin Deward MOULD, MD;  Location: [facility];  Service: General;  Laterality: Right;   EYE SURGERY     Patient Active Problem List   Diagnosis Date Noted   SCC (squamous cell carcinoma) 10/20/2023   Hypercalcemia 10/20/2023   Back pain 10/20/2023   Vertigo 10/20/2023   Advance care planning 10/20/2023   Malignant neoplasm of lower-outer quadrant of right breast of female, estrogen receptor positive (HCC) 10/16/2021   HYPERLIPIDEMIA 06/17/2006   HYPERTENSION, BENIGN SYSTEMIC 06/17/2006   INSOMNIA NOS 06/17/2006    ONSET DATE: 11/05/2023  REFERRING DIAG:  R26.89 (ICD-10-CM) - Balance problem    THERAPY DIAG:  Unsteadiness on feet  Muscle weakness (generalized)  Other abnormalities of gait and mobility  Rationale for Evaluation and Treatment: Rehabilitation  SUBJECTIVE:                                                                                                                                                                                              SUBJECTIVE STATEMENT: Ambulates in with a SPC. About the end of April, was given some medication that did not agree with her and had difficulty walking. Takes exercise classes for forever. Before that did not have a problem. In the home can walk without her cane. Wants to be able to go back to her exercise class - does Silver  Sneakers 2 times a week. Lives in [place] about 2 hours away, but wants to come to therapy here as her doctors are in [place]. Last time she went to the exercise class was in April   Pt accompanied by: Daughter, Ginny   PERTINENT HISTORY: PMH: HTN, hypercalcemia   PAIN:  Are you having pain? No  Vitals:   01/03/24 0818  BP: (!) 161/51  Pulse: (!) 52     PRECAUTIONS: Fall  FALLS: Has patient fallen in last 6 months? No  LIVING ENVIRONMENT: Lives with: lives alone Lives in: House/apartment Stairs: No Has following equipment at home: Single point cane, Environmental consultant - 2 wheeled, and Grab bars  PLOF: Independent Drives in town distances   PATIENT GOALS: Wants to go back to her exercise class   OBJECTIVE:  Note: Objective measures were completed at Evaluation unless otherwise noted.   COGNITION: Overall cognitive status: Within functional limits for tasks assessed   SENSATION: WFL  POSTURE: rounded shoulders   LOWER EXTREMITY MMT:    MMT Right Eval Left Eval  Hip flexion 5 4+  Hip extension    Hip abduction 5 5  Hip adduction 5 5  Hip internal rotation    Hip external rotation    Knee flexion 5 5  Knee extension 4+ 5  Ankle dorsiflexion 5 5  Ankle plantarflexion    Ankle inversion    Ankle eversion    (Blank rows = not tested)  BED MOBILITY:  Pt reports no issues   TRANSFERS: Sit to stand: Modified independence  Assistive device utilized: None     Stand to sit:  Modified independence  Assistive device utilized: None      GAIT: Findings: Gait Characteristics: step through pattern, decreased stride length, and trunk flexed, Distance walked: Clinic distances , Assistive device utilized:Single point cane and None, Level of assistance: Modified independence and SBA, and Comments: Mod I when using SPC (pt holding it up at times)   FUNCTIONAL TESTS:  5 times sit to stand: 16.4 seconds with no UE support  10 meter walk test: 12.1 seconds with no AD = 2.71 ft/sec    [facility] PT Assessment - 01/03/24 0833       Functional Gait  Assessment   Gait assessed  Yes    Gait Level Surface Walks 20 ft, slow speed, abnormal gait pattern, evidence for imbalance or deviates 10-15 in outside of the 12 in walkway width. Requires more than 7 sec to ambulate 20 ft.   7.4 seconds   Change in Gait Speed Able to change speed, demonstrates mild gait deviations, deviates 6-10 in outside of the 12 in walkway width, or no gait deviations, unable to achieve a major change in velocity, or uses a change in velocity, or uses an assistive device.    Gait with Horizontal Head Turns Performs head turns smoothly with slight change in gait velocity (eg, minor disruption to smooth gait path), deviates 6-10 in outside 12 in walkway width, or uses an assistive device.    Gait with Vertical Head Turns Performs task with slight change in gait velocity (eg, minor disruption to smooth gait path), deviates 6 - 10 in outside 12 in walkway width or uses assistive device    Gait and Pivot Turn Pivot turns safely within 3 sec and stops quickly with no loss of balance.    Step Over Obstacle Cannot perform without assistance.   needs HHA   Gait with Narrow Base of Support Ambulates less than 4  steps heel to toe or cannot perform without assistance.    Gait with Eyes Closed Walks 20 ft, slow speed, abnormal gait pattern, evidence for imbalance, deviates 10-15 in outside 12 in walkway width. Requires more than 9  sec to ambulate 20 ft.   14 seconds   Ambulating Backwards Walks 20 ft, slow speed, abnormal gait pattern, evidence for imbalance, deviates 10-15 in outside 12 in walkway width.   23.6 seconds   Steps Alternating feet, must use rail.    Total Score 14    FGA comment: 14/30                                                                                                                                      TREATMENT DATE: 01/03/24  Self-Care: Discussed POC - pt does not live in [place] and just comes to [place] for her MD appts. Wants to come to this clinic, discussed with pt will need to have at least 1 visit every 30 days, otherwise pt will need new referral. Pt and pt's daughter in agreement to come 1x a month Discussed fall risk based on FGA and that pt should be using her SPC  Clinical findings, POC Initiated HEP for sit <> stands with no UE support   Access Code: [facility] URL: [email or web address] Date: 01/03/2024 Prepared by: Sheffield Senate  Exercises - Sit to Stand  - 2 x daily - 7 x weekly - 1-2 sets - 10 reps    PATIENT EDUCATION: Education details: See above  Person educated: Patient and Child(ren) Education method: Explanation, Demonstration, Verbal cues, and Handouts Education comprehension: verbalized understanding and returned demonstration  HOME EXERCISE PROGRAM: Access Code: [facility] URL: [email or web address] Date: 01/03/2024 Prepared by: Sheffield Senate  Exercises - Sit to Stand  - 2 x daily - 7 x weekly - 1-2 sets - 10 reps  GOALS: Goals reviewed with patient? Yes  SHORT TERM GOALS: Target date: 01/31/2024  Pt will improve 5x sit<>stand to less than or equal to 13 sec to demonstrate improved functional strength and transfer efficiency.  Baseline: 16.4 seconds with no UE support  Goal status: INITIAL   LONG TERM GOALS: Target date: 02/28/2024  Pt will improve FGA to at  least a 18/30 in order to demo decr fall risk. Baseline: 14/30 Goal status: INITIAL  2.  Pt will improve gait speed with no AD vs. SPC to at least 2.9 ft/sec in order to demo improved community mobility.   Baseline: 12.1 seconds with no AD = 2.71 ft/sec  Goal status: INITIAL    ASSESSMENT:  CLINICAL IMPRESSION: Patient is a 89 year old female referred to Neuro OPPT for balance impairment.   Pt's PMH is significant for: HTN, hypercalcemia. Pt reports ever since she had a medication change a few months ago has gotten weaker and wants to be able to return to her  silver sneakers walking program. Pt currently has a cane for all community distances, but sometimes just holds it. The following deficits were present during the exam: decr strength, gait abnormalities, decr activity tolerance, impaired balance, impaired posture. Based on FGA, pt is an incr risk for falls. Pt would benefit from skilled PT to address these impairments and functional limitations to maximize functional mobility independence and decr fall risk. Due to pt's distance from clinic, she is only able to come 1x a month (pt still wants to receive PT services in [place] despite living in [place], [place] as she comes here for her appts).    OBJECTIVE IMPAIRMENTS: Abnormal gait, decreased activity tolerance, decreased balance, decreased strength, and postural dysfunction.   ACTIVITY LIMITATIONS: locomotion level  PARTICIPATION LIMITATIONS: community activity and going to silver sneakers classes   PERSONAL FACTORS: Age, Past/current experiences, Time since onset of injury/illness/exacerbation, 1-2 comorbidities: HTN, hypercalcemia , and distance from clinic - only able to come 1x a month  are also affecting patient's functional outcome.   REHAB POTENTIAL: Good  CLINICAL DECISION MAKING: Stable/uncomplicated  EVALUATION COMPLEXITY: Low  PLAN:  PT FREQUENCY: 1x/month - due to distance from clinic   PT DURATION: other: 3 months    PLANNED INTERVENTIONS: [id number]- PT Re-evaluation, 97110-Therapeutic exercises, 97530- Therapeutic activity, [id number]- Neuromuscular re-education, 97535- Self Care, [id number]- Manual therapy, [phone]- Gait training, Patient/Family education, Balance training, Stair training, and DME instructions  PLAN FOR NEXT SESSION: initiate HEP for balance - SLS, head motions, EC, and functional strength    Sheffield LOISE Senate, PT, DPT 01/03/2024, 12:12 PM

## 2024-01-03 ENCOUNTER — Encounter: Payer: Self-pay | Admitting: Physical Therapy

## 2024-01-03 ENCOUNTER — Ambulatory Visit: Attending: Family Medicine | Admitting: Physical Therapy

## 2024-01-03 VITALS — BP 161/51 | HR 52

## 2024-01-03 DIAGNOSIS — R2681 Unsteadiness on feet: Secondary | ICD-10-CM | POA: Insufficient documentation

## 2024-01-03 DIAGNOSIS — R2689 Other abnormalities of gait and mobility: Secondary | ICD-10-CM | POA: Diagnosis present

## 2024-01-03 DIAGNOSIS — M6281 Muscle weakness (generalized): Secondary | ICD-10-CM | POA: Diagnosis present

## 2024-01-04 ENCOUNTER — Other Ambulatory Visit (INDEPENDENT_AMBULATORY_CARE_PROVIDER_SITE_OTHER)

## 2024-01-04 NOTE — Progress Notes (Signed)
 Agree.  Thanks.  Arlyss EDISON Cleatus, MD 01/04/24  6:33 AM

## 2024-01-05 LAB — PTH, INTACT AND CALCIUM
Calcium: 10.7 mg/dL — ABNORMAL HIGH (ref 8.6–10.4)
PTH: 39 pg/mL (ref 16–77)

## 2024-01-06 ENCOUNTER — Ambulatory Visit: Admitting: Podiatry

## 2024-01-06 ENCOUNTER — Ambulatory Visit (INDEPENDENT_AMBULATORY_CARE_PROVIDER_SITE_OTHER)

## 2024-01-06 VITALS — BP 112/64 | Ht <= 58 in | Wt 121.2 lb

## 2024-01-06 DIAGNOSIS — Z0000 Encounter for general adult medical examination without abnormal findings: Secondary | ICD-10-CM

## 2024-01-06 NOTE — Progress Notes (Signed)
 Subjective:   Patricia Mclaughlin is a 89 y.o. who presents for a Medicare Wellness preventive visit.  As a reminder, Annual Wellness Visits don't include a physical exam, and some assessments may be limited, especially if this visit is performed virtually. We may recommend an in-person follow-up visit with your provider if needed.  Visit Complete: In person  Persons Participating in Visit: Patient assisted by Shelly/daughter.  AWV Questionnaire: No: Patient Medicare AWV questionnaire was not completed prior to this visit.  Cardiac Risk Factors include: advanced age (>22men, >37 women);dyslipidemia;hypertension     Objective:    Today's Vitals   01/06/24 0901  BP: 112/64  Weight: 121 lb 3.2 oz (55 kg)  Height: 4' 9.6 (1.463 m)   Body mass index is 25.68 kg/m.     01/06/2024    9:19 AM 01/03/2024    8:04 AM 11/17/2021    7:16 AM 11/11/2021   10:04 AM 10/16/2021    1:34 PM  Advanced Directives  Does Patient Have a Medical Advance Directive? Yes Yes Yes Yes Yes  Type of Advance Directive Living will;Healthcare Power of [employer] Power of [place];Living will Healthcare Power of [place];Living will Healthcare Power of [place];Living will  Does patient want to make changes to medical advance directive?   No - Patient declined  No - Patient declined  Copy of Healthcare Power of Attorney in Chart? No - copy requested  No - copy requested  No - copy requested    Current Medications (verified) Outpatient Encounter Medications as of 01/06/2024  Medication Sig   amLODipine  (NORVASC ) 2.5 MG tablet Take 2.5 mg by mouth daily.   amLODipine  (NORVASC ) 5 MG tablet Take 5 mg by mouth daily.   atorvastatin  (LIPITOR) 40 MG tablet Take 40 mg by mouth daily.   bisoprolol (ZEBETA) 10 MG tablet Take 1 tablet by mouth 2 (two) times daily.   cholecalciferol (VITAMIN D3) 25 MCG (1000 UNIT) tablet Take 1,000 Units by mouth daily.   Cyanocobalamin (VITAMIN B12) 1000 MCG TBCR Take 100  mcg by mouth daily.   fexofenadine (ALLEGRA) 180 MG tablet Take 180 mg by mouth daily.   fluorouracil  (EFUDEX ) 5 % cream Apply to affected area of the right upper lip (hold for severe inflammation) Externally Twice a week   lidocaine  (LIDODERM ) 5 % Place 1 patch onto the skin daily as needed. May wear for up to 12 hours.  Remove and leave off for 12 hours.   magnesium oxide (MAG-OX) 400 MG tablet Take 1 tablet by mouth daily.   mupirocin  ointment (BACTROBAN ) 2 % Apply topically 2 times daily.   potassium chloride  (KLOR-CON ) 8 MEQ tablet Take 8 mEq by mouth in the morning and at bedtime.   zolpidem (AMBIEN) 5 MG tablet Take 0.5 tablets (2.5 mg total) by mouth at bedtime as needed for sleep.   No facility-administered encounter medications on file as of 01/06/2024.    Allergies (verified) Ace inhibitors, Angiotensin receptor blockers, Cinacalcet, and Hydrochlorothiazide   History: Past Medical History:  Diagnosis Date   DCIS (ductal carcinoma in situ) of breast    High cholesterol    Hypercalcemia    Hypertension    Insomnia    SCC (squamous cell carcinoma)    Past Surgical History:  Procedure Laterality Date   ABDOMINAL HYSTERECTOMY     BREAST LUMPECTOMY WITH RADIOACTIVE SEED LOCALIZATION Right 11/17/2021   Procedure: RIGHT BREAST RADIOACTIVE SEED LOCALIZED LUMPECTOMY;  Surgeon: Curvin Deward MOULD, MD;  Location: [facility];  Service: General;  Laterality: Right;   EYE SURGERY     Family History  Problem Relation Age of Onset   Breast cancer Mother    Social History   Socioeconomic History   Marital status: Widowed    Spouse name: Not on file   Number of children: Not on file   Years of education: Not on file   Highest education level: Some college, no degree  Occupational History   Not on file  Tobacco Use   Smoking status: Never   Smokeless tobacco: Never  Vaping Use   Vaping status: Never Used  Substance and Sexual Activity   Alcohol use: Never   Drug  use: Never   Sexual activity: Not on file  Other Topics Concern   Not on file  Social History Narrative   Widowed 2015   Previously lived in [place] .   Grew up in [place] and [place].   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   [employer] graduate.   Active, transferred care 2025 to be closer to family.   Social Drivers of Corporate investment banker Strain: Low Risk  (01/06/2024)   Overall Financial Resource Strain (CARDIA)    Difficulty of Paying Living Expenses: Not hard at all  Food Insecurity: No Food Insecurity (01/06/2024)   Hunger Vital Sign    Worried About Running Out of Food in the Last Year: Never true    Ran Out of Food in the Last Year: Never true  Transportation Needs: No Transportation Needs (01/06/2024)   PRAPARE - Administrator, Civil Service (Medical): No    Lack of Transportation (Non-Medical): No  Physical Activity: Sufficiently Active (01/06/2024)   Exercise Vital Sign    Days of Exercise per Week: 5 days    Minutes of Exercise per Session: 30 min  Recent Concern: Physical Activity - Inactive (10/17/2023)   Exercise Vital Sign    Days of Exercise per Week: 0 days    Minutes of Exercise per Session: Not on file  Stress: No Stress Concern Present (01/06/2024)   [employer] of Occupational Health - Occupational Stress Questionnaire    Feeling of Stress: Not at all  Social Connections: Moderately Integrated (01/06/2024)   Social Connection and Isolation Panel    Frequency of Communication with Friends and Family: More than three times a week    Frequency of Social Gatherings with Friends and Family: More than three times a week    Attends Religious Services: More than 4 times per year    Active Member of [employer] or Organizations: Yes    Attends Banker Meetings: More than 4 times per year    Marital Status: Widowed    Tobacco Counseling Counseling given: Not Answered    Clinical  Intake:  Pre-visit preparation completed: Yes  Pain : No/denies pain    BMI - recorded: 25.68 Nutritional Status: BMI 25 -29 Overweight Nutritional Risks: None Diabetes: No  No results found for: HGBA1C   How often do you need to have someone help you when you read instructions, pamphlets, or other written materials from your doctor or pharmacy?: 1 - Never  Interpreter Needed?: No  Comments: lives alo9ne Information entered by :: B.Shatika Grinnell,LPN   Activities of Daily Living     01/06/2024    9:20 AM  In your present state of health, do you have any difficulty performing the following activities:  Hearing? 0  Vision? 0  Difficulty concentrating or making decisions? 0  Walking or climbing stairs? 0  Dressing or bathing? 0  Doing errands, shopping? 0  Preparing Food and eating ? N  Using the Toilet? N  In the past six months, have you accidently leaked urine? N  Do you have problems with loss of bowel control? N  Managing your Medications? N  Managing your Finances? N  Housekeeping or managing your Housekeeping? N    Patient Care Team: Cleatus Arlyss RAMAN, MD as PCP - General (Family Medicine) Odean Potts, MD as Consulting Physician (Hematology and Oncology) Dewey Rush, MD as Consulting Physician (Radiation Oncology) Curvin Deward MOULD, MD as Consulting Physician (General Surgery)  I have updated your Care Teams any recent Medical Services you may have received from other providers in the past year.     Assessment:   This is a routine wellness examination for Patricia Mclaughlin.  Hearing/Vision screen Hearing Screening - Comments:: Patient denies any hearing difficulties.   Vision Screening - Comments:: Pt says their vision is good with glasses Dr  Adler-retired;will go to another provider at [facility]   Goals Addressed             This Visit's Progress    Patient Stated       I would like to eliminating walking with cane       Depression Screen      01/06/2024    9:14 AM  PHQ 2/9 Scores  PHQ - 2 Score 0    Fall Risk     01/06/2024    9:07 AM  Fall Risk   Falls in the past year? 0  Number falls in past yr: 0  Injury with Fall? 0  Risk for fall due to : No Fall Risks  Follow up Education provided;Falls prevention discussed    MEDICARE RISK AT HOME:  Medicare Risk at Home Any stairs in or around the home?: No If so, are there any without handrails?: Yes Home free of loose throw rugs in walkways, pet beds, electrical cords, etc?: Yes Adequate lighting in your home to reduce risk of falls?: Yes Life alert?: No Use of a cane, walker or w/c?: Yes Grab bars in the bathroom?: Yes Shower chair or bench in shower?: No Elevated toilet seat or a handicapped toilet?: Yes  TIMED UP AND GO:  Was the test performed?  Yes  Length of time to ambulate 10 feet: 18 sec Gait slow and steady with assistive device  Cognitive Function: 6CIT completed        01/06/2024    9:22 AM  6CIT Screen  What Year? 0 points  What month? 0 points  What time? 0 points  Count back from 20 0 points  Months in reverse 0 points  Repeat phrase 0 points  Total Score 0 points    Immunizations Immunization History  Administered Date(s) Administered   Fluad Quad(high Dose 65+) 01/12/2023   PFIZER(Purple Top)SARS-COV-2 Vaccination 04/28/2019, 05/19/2019, 01/16/2020   Pneumococcal Conjugate-13 05/21/2014   Pneumococcal Polysaccharide-23 02/03/2017   Tdap 04/21/2017    Screening Tests Health Maintenance  Topic Date Due   Zoster Vaccines- Shingrix (1 of 2) Never done   DEXA SCAN  Never done   Influenza Vaccine  11/19/2023   COVID-19 Vaccine (4 - 2025-26 season) 12/20/2023   Medicare Annual Wellness (AWV)  01/05/2025   DTaP/Tdap/Td (2 - Td or Tdap) 04/22/2027   Pneumococcal Vaccine: 50+ Years  Completed   HPV VACCINES  Aged Out  Meningococcal B Vaccine  Aged Out    Health Maintenance Items Addressed: None due at this time. Pt says she has an  appt this afternoon at pharmacy for Covid and Influenza vaccine   Additional Screening:  Vision Screening: Recommended annual ophthalmology exams for early detection of glaucoma and other disorders of the eye. Is the patient up to date with their annual eye exam?  No  Who is the provider or what is the name of the office in which the patient attends annual eye exams? Dr Daun (just retired):will make appt at [facility] Screening: Recommended annual dental exams for proper oral hygiene  Community Resource Referral / Chronic Care Management: CRR required this visit?  No   CCM required this visit?  Appt scheduled with PCP   Plan:    I have personally reviewed and noted the following in the patient's chart:   Medical and social history Use of alcohol, tobacco or illicit drugs  Current medications and supplements including opioid prescriptions. Patient is not currently taking opioid prescriptions. Functional ability and status Nutritional status Physical activity Advanced directives List of other physicians Hospitalizations, surgeries, and ER visits in previous 12 months Vitals Screenings to include cognitive, depression, and falls Referrals and appointments  In addition, I have reviewed and discussed with patient certain preventive protocols, quality metrics, and best practice recommendations. A written personalized care plan for preventive services as well as general preventive health recommendations were provided to patient.   Erminio LITTIE Saris, LPN   0/81/[id number]   After Visit Summary: (MyChart) Due to this being a telephonic visit, the after visit summary with patients personalized plan was offered to patient via MyChart   Notes: Nothing significant to report at this time.

## 2024-01-06 NOTE — Patient Instructions (Signed)
 Patricia Mclaughlin,  Thank you for taking the time for your Medicare Wellness Visit. I appreciate your continued commitment to your health goals. Please review the care plan we discussed, and feel free to reach out if I can assist you further.  Medicare recommends these wellness visits once per year to help you and your care team stay ahead of potential health issues. These visits are designed to focus on prevention, allowing your provider to concentrate on managing your acute and chronic conditions during your regular appointments.  Please note that Annual Wellness Visits do not include a physical exam. Some assessments may be limited, especially if the visit was conducted virtually. If needed, we may recommend a separate in-person follow-up with your provider.  Ongoing Care Seeing your primary care provider every 3 to 6 months helps us  monitor your health and provide consistent, personalized care.   Referrals If a referral was made during today's visit and you haven't received any updates within two weeks, please contact the referred provider directly to check on the status.  Recommended Screenings:  Health Maintenance  Topic Date Due   Zoster (Shingles) Vaccine (1 of 2) Never done   DEXA scan (bone density measurement)  Never done   Flu Shot  11/19/2023   COVID-19 Vaccine (4 - 2025-26 season) 12/20/2023   Medicare Annual Wellness Visit  01/05/2025   DTaP/Tdap/Td vaccine (2 - Td or Tdap) 04/22/2027   Pneumococcal Vaccine for age over 47  Completed   HPV Vaccine  Aged Out   Meningitis B Vaccine  Aged Out       01/03/2024    8:04 AM  Advanced Directives  Does Patient Have a Medical Advance Directive? Yes   Advance Care Planning is important because it: Ensures you receive medical care that aligns with your values, goals, and preferences. Provides guidance to your family and loved ones, reducing the emotional burden of decision-making during critical moments.  Vision: Annual vision  screenings are recommended for early detection of glaucoma, cataracts, and diabetic retinopathy. These exams can also reveal signs of chronic conditions such as diabetes and high blood pressure.  Dental: Annual dental screenings help detect early signs of oral cancer, gum disease, and other conditions linked to overall health, including heart disease and diabetes.

## 2024-01-10 ENCOUNTER — Other Ambulatory Visit: Payer: Self-pay | Admitting: Family Medicine

## 2024-01-10 ENCOUNTER — Ambulatory Visit: Payer: Self-pay | Admitting: Family Medicine

## 2024-01-31 ENCOUNTER — Encounter: Payer: Self-pay | Admitting: Physical Therapy

## 2024-01-31 ENCOUNTER — Ambulatory Visit: Attending: Family Medicine | Admitting: Physical Therapy

## 2024-01-31 VITALS — BP 130/72

## 2024-01-31 DIAGNOSIS — R2681 Unsteadiness on feet: Secondary | ICD-10-CM | POA: Diagnosis present

## 2024-01-31 DIAGNOSIS — R2689 Other abnormalities of gait and mobility: Secondary | ICD-10-CM | POA: Insufficient documentation

## 2024-01-31 DIAGNOSIS — M6281 Muscle weakness (generalized): Secondary | ICD-10-CM | POA: Diagnosis present

## 2024-01-31 NOTE — Therapy (Signed)
 OUTPATIENT PHYSICAL THERAPY NEURO TREATMENT   Patient Name: Patricia Mclaughlin MRN: [id number] DOB:[birth year], 89 y.o., female Today's Date: 01/31/2024   PCP: Cleatus Arlyss RAMAN, MD REFERRING PROVIDER: Cleatus Arlyss RAMAN, MD  END OF SESSION:  PT End of Session - 01/31/24 0805     Visit Number 2    Number of Visits 4    Date for Recertification  04/02/24   due to delay in scheduling   Authorization Type MEDICARE    PT Start Time 0803    PT Stop Time 0843    PT Time Calculation (min) 40 min    Equipment Utilized During Treatment Gait belt    Activity Tolerance Patient tolerated treatment well    Behavior During Therapy [facility] for tasks assessed/performed          Past Medical History:  Diagnosis Date   DCIS (ductal carcinoma in situ) of breast    High cholesterol    Hypercalcemia    Hypertension    Insomnia    SCC (squamous cell carcinoma)    Past Surgical History:  Procedure Laterality Date   ABDOMINAL HYSTERECTOMY     BREAST LUMPECTOMY WITH RADIOACTIVE SEED LOCALIZATION Right 11/17/2021   Procedure: RIGHT BREAST RADIOACTIVE SEED LOCALIZED LUMPECTOMY;  Surgeon: Curvin Deward MOULD, MD;  Location: [facility];  Service: General;  Laterality: Right;   EYE SURGERY     Patient Active Problem List   Diagnosis Date Noted   SCC (squamous cell carcinoma) 10/20/2023   Hypercalcemia 10/20/2023   Back pain 10/20/2023   Vertigo 10/20/2023   Advance care planning 10/20/2023   Malignant neoplasm of lower-outer quadrant of right breast of female, estrogen receptor positive (HCC) 10/16/2021   HYPERLIPIDEMIA 06/17/2006   HYPERTENSION, BENIGN SYSTEMIC 06/17/2006   INSOMNIA NOS 06/17/2006    ONSET DATE: 11/05/2023  REFERRING DIAG:  R26.89 (ICD-10-CM) - Balance problem    THERAPY DIAG:  Muscle weakness (generalized)  Unsteadiness on feet  Other abnormalities of gait and mobility  Rationale for Evaluation and Treatment: Rehabilitation  SUBJECTIVE:                                                                                                                                                                                              SUBJECTIVE STATEMENT: Has been working on her sit <> stands at home. Still using the cane when outside. Hoping to return to her exercise classes in a few weeks. Has been doing some walking.   Pt accompanied by: Daughter, Ginny   PERTINENT HISTORY: PMH: HTN, hypercalcemia   PAIN:  Are you having pain? No  Vitals:  01/31/24 0810  BP: 130/72      PRECAUTIONS: Fall  FALLS: Has patient fallen in last 6 months? No  LIVING ENVIRONMENT: Lives with: lives alone Lives in: House/apartment Stairs: No Has following equipment at home: Single point cane, Environmental consultant - 2 wheeled, and Grab bars  PLOF: Independent Drives in town distances   PATIENT GOALS: Wants to go back to her exercise class   OBJECTIVE:  Note: Objective measures were completed at Evaluation unless otherwise noted.   COGNITION: Overall cognitive status: Within functional limits for tasks assessed   SENSATION: WFL  POSTURE: rounded shoulders   LOWER EXTREMITY MMT:    MMT Right Eval Left Eval  Hip flexion 5 4+  Hip extension    Hip abduction 5 5  Hip adduction 5 5  Hip internal rotation    Hip external rotation    Knee flexion 5 5  Knee extension 4+ 5  Ankle dorsiflexion 5 5  Ankle plantarflexion    Ankle inversion    Ankle eversion    (Blank rows = not tested)  BED MOBILITY:  Pt reports no issues   TRANSFERS: Sit to stand: Modified independence  Assistive device utilized: None     Stand to sit: Modified independence  Assistive device utilized: None      GAIT: Findings: Gait Characteristics: step through pattern, decreased stride length, and trunk flexed, Distance walked: Clinic distances , Assistive device utilized:Single point cane and None, Level of assistance: Modified independence and SBA, and Comments: Mod I when using  SPC (pt holding it up at times)   FUNCTIONAL TESTS:  5 times sit to stand: 16.4 seconds with no UE support  10 meter walk test: 12.1 seconds with no AD = 2.71 ft/sec                                                                                                                                  TREATMENT DATE: 01/31/24  Therapeutic Activity:  Vitals:   01/31/24 0810  BP: 130/72    5x sit <> stand: 9.3 seconds with no UE support    Access Code: [email or web address] URL: [email or web address] Date: 01/31/2024 Prepared by: Sheffield Senate  Initiated and added to HEP:   Exercises - Sit to Stand  - 2 x daily - 7 x weekly - 1-2 sets - 10 reps - Standing Hip Abduction with Counter Support  - 1-2 x daily - 5 x weekly - 2 sets - 10 reps - Standing Single Leg Stance with Counter Support  - 1-2 x daily - 5 x weekly - 3 sets - 10-15 hold - Standing Marching  - 1-2 x daily - 5 x weekly - 3 sets - Tandem Walking with Counter Support  - 1-2 x daily - 5 x weekly - 3 sets - Wide Stance with Eyes Closed on Foam Pad  - 1-2 x daily - 5 x weekly - 3 sets - 30 hold -  Standing with Head Rotation  - 1-2 x daily - 5 x weekly - 2 sets - 10 reps - performed on air ex with head turns and head nods    PATIENT EDUCATION: Education details: Initiation of HEP, purchasing a balance pad from [employer] educated: Patient and Child(ren) Education method: Programmer, multimedia, Facilities manager, Verbal cues, and Handouts Education comprehension: verbalized understanding and returned demonstration  HOME EXERCISE PROGRAM: Access Code: [facility] URL: [email or web address] Date: 01/31/2024 Prepared by: Sheffield Senate  Exercises - Sit to Stand  - 2 x daily - 7 x weekly - 1-2 sets - 10 reps - Standing Hip Abduction with Counter Support  - 1-2 x daily - 5 x weekly - 2 sets - 10 reps - Standing Single Leg Stance with Counter Support  - 1-2 x daily - 5 x weekly - 3 sets - 10-15 hold - Standing Marching   - 1-2 x daily - 5 x weekly - 3 sets - Tandem Walking with Counter Support  - 1-2 x daily - 5 x weekly - 3 sets - Wide Stance with Eyes Closed on Foam Pad  - 1-2 x daily - 5 x weekly - 3 sets - 30 hold - Standing with Head Rotation  - 1-2 x daily - 5 x weekly - 2 sets - 10 reps - performed on air ex with head turns and head nods   GOALS: Goals reviewed with patient? Yes  SHORT TERM GOALS: Target date: 01/31/2024  Pt will improve 5x sit<>stand to less than or equal to 13 sec to demonstrate improved functional strength and transfer efficiency.  Baseline: 16.4 seconds with no UE support   9.3 seconds with no UE support (10/13) Goal status: MET   LONG TERM GOALS: Target date: 02/28/2024  Pt will improve FGA to at least a 18/30 in order to demo decr fall risk. Baseline: 14/30 Goal status: INITIAL  2.  Pt will improve gait speed with no AD vs. SPC to at least 2.9 ft/sec in order to demo improved community mobility.   Baseline: 12.1 seconds with no AD = 2.71 ft/sec  Goal status: INITIAL    ASSESSMENT:  CLINICAL IMPRESSION: Pt returns to PT session (only able to come 1x a month due to distance from clinic). Assessed 5x sit <> stand for STG with pt meeting goal and able to perform in 9.3 seconds (previously was 16.4 seconds). Remainder of session focused on adding to HEP to work on functional strength and balance. Pt tolerated well with corner/counter exercises and planning on purchasing a balance pad. Will continue per POC.    OBJECTIVE IMPAIRMENTS: Abnormal gait, decreased activity tolerance, decreased balance, decreased strength, and postural dysfunction.   ACTIVITY LIMITATIONS: locomotion level  PARTICIPATION LIMITATIONS: community activity and going to silver sneakers classes   PERSONAL FACTORS: Age, Past/current experiences, Time since onset of injury/illness/exacerbation, 1-2 comorbidities: HTN, hypercalcemia , and distance from clinic - only able to come 1x a month  are also  affecting patient's functional outcome.   REHAB POTENTIAL: Good  CLINICAL DECISION MAKING: Stable/uncomplicated  EVALUATION COMPLEXITY: Low  PLAN:  PT FREQUENCY: 1x/month - due to distance from clinic   PT DURATION: other: 3 months   PLANNED INTERVENTIONS: 97164- PT Re-evaluation, 97110-Therapeutic exercises, 97530- Therapeutic activity, [id number]- Neuromuscular re-education, 97535- Self Care, [id number]- Manual therapy, [phone]- Gait training, Patient/Family education, Balance training, Stair training, and DME instructions  PLAN FOR NEXT SESSION: work on balance - SLS, head motions, EC, and functional strength, unlevel surfaces  Sheffield LOISE Senate, PT, DPT 01/31/2024, 9:06 AM

## 2024-02-08 ENCOUNTER — Ambulatory Visit: Admitting: Podiatry

## 2024-02-09 ENCOUNTER — Other Ambulatory Visit: Payer: Self-pay | Admitting: Family Medicine

## 2024-02-10 NOTE — Telephone Encounter (Signed)
 This is listed in chart as historical provider ok to refill?

## 2024-02-11 ENCOUNTER — Telehealth: Payer: Self-pay | Admitting: Family Medicine

## 2024-02-11 NOTE — Telephone Encounter (Signed)
 Copied from CRM [id number]. Topic: Clinical - Prescription Issue >> Feb 11, 2024 10:58 AM Herma G wrote: Reason for CRM: Staff from [facility] - [place], [place] - [address] called to check on the status of Potassium Chloride  8 MEQ T and Magnesium Oxide 1 tablet Oral Daily, I could not find info to relay. Call pt back at [phone] to update her on the status of refills as staff states that she has been calling them.

## 2024-02-11 NOTE — Telephone Encounter (Signed)
 Sent. Thanks.

## 2024-02-11 NOTE — Telephone Encounter (Signed)
 Refills have already been sent to [facility] to approve.

## 2024-02-28 ENCOUNTER — Ambulatory Visit (INDEPENDENT_AMBULATORY_CARE_PROVIDER_SITE_OTHER): Admitting: Family Medicine

## 2024-02-28 ENCOUNTER — Encounter: Payer: Self-pay | Admitting: Physical Therapy

## 2024-02-28 ENCOUNTER — Encounter: Payer: Self-pay | Admitting: Family Medicine

## 2024-02-28 ENCOUNTER — Ambulatory Visit: Attending: Family Medicine | Admitting: Physical Therapy

## 2024-02-28 VITALS — BP 138/64 | HR 63 | Ht <= 58 in | Wt 118.2 lb

## 2024-02-28 DIAGNOSIS — Z780 Asymptomatic menopausal state: Secondary | ICD-10-CM | POA: Diagnosis not present

## 2024-02-28 DIAGNOSIS — R2681 Unsteadiness on feet: Secondary | ICD-10-CM | POA: Insufficient documentation

## 2024-02-28 DIAGNOSIS — R2689 Other abnormalities of gait and mobility: Secondary | ICD-10-CM | POA: Diagnosis present

## 2024-02-28 DIAGNOSIS — Z7189 Other specified counseling: Secondary | ICD-10-CM

## 2024-02-28 DIAGNOSIS — G4700 Insomnia, unspecified: Secondary | ICD-10-CM

## 2024-02-28 DIAGNOSIS — I10 Essential (primary) hypertension: Secondary | ICD-10-CM | POA: Diagnosis not present

## 2024-02-28 DIAGNOSIS — M6281 Muscle weakness (generalized): Secondary | ICD-10-CM | POA: Insufficient documentation

## 2024-02-28 DIAGNOSIS — E785 Hyperlipidemia, unspecified: Secondary | ICD-10-CM

## 2024-02-28 DIAGNOSIS — Z0000 Encounter for general adult medical examination without abnormal findings: Secondary | ICD-10-CM

## 2024-02-28 NOTE — Therapy (Signed)
 OUTPATIENT PHYSICAL THERAPY NEURO TREATMENT   Patient Name: Patricia Mclaughlin MRN: [id number] DOB:Aug 07, 1925, 89 y.o., female Today's Date: 02/28/2024   PCP: Cleatus Arlyss RAMAN, MD REFERRING PROVIDER: Cleatus Arlyss RAMAN, MD  END OF SESSION:  PT End of Session - 02/28/24 0800     Visit Number 3    Number of Visits 4    Date for Recertification  04/02/24   due to delay in scheduling   Authorization Type MEDICARE    PT Start Time 0758    PT Stop Time 0842    PT Time Calculation (min) 44 min    Equipment Utilized During Treatment Gait belt    Activity Tolerance Patient tolerated treatment well    Behavior During Therapy [facility] for tasks assessed/performed          Past Medical History:  Diagnosis Date   DCIS (ductal carcinoma in situ) of breast    High cholesterol    Hypercalcemia    Hypertension    Insomnia    SCC (squamous cell carcinoma)    Past Surgical History:  Procedure Laterality Date   ABDOMINAL HYSTERECTOMY     BREAST LUMPECTOMY WITH RADIOACTIVE SEED LOCALIZATION Right 11/17/2021   Procedure: RIGHT BREAST RADIOACTIVE SEED LOCALIZED LUMPECTOMY;  Surgeon: Curvin Deward MOULD, MD;  Location: [facility];  Service: General;  Laterality: Right;   EYE SURGERY     Patient Active Problem List   Diagnosis Date Noted   SCC (squamous cell carcinoma) 10/20/2023   Hypercalcemia 10/20/2023   Back pain 10/20/2023   Vertigo 10/20/2023   Advance care planning 10/20/2023   Malignant neoplasm of lower-outer quadrant of right breast of female, estrogen receptor positive (HCC) 10/16/2021   HYPERLIPIDEMIA 06/17/2006   HYPERTENSION, BENIGN SYSTEMIC 06/17/2006   INSOMNIA NOS 06/17/2006    ONSET DATE: 11/05/2023  REFERRING DIAG:  R26.89 (ICD-10-CM) - Balance problem    THERAPY DIAG:  Muscle weakness (generalized)  Unsteadiness on feet  Other abnormalities of gait and mobility  Rationale for Evaluation and Treatment: Rehabilitation  SUBJECTIVE:                                                                                                                                                                                              SUBJECTIVE STATEMENT: Walks with no AD at home. Has been trying her exercises at home. Pretty much doing what she was doing before.   Pt accompanied by: Daughter, Ginny   PERTINENT HISTORY: PMH: HTN, hypercalcemia   PAIN:  Are you having pain? No  There were no vitals filed for this visit.   PRECAUTIONS: Fall  FALLS: Has patient fallen in last 6 months? No  LIVING ENVIRONMENT: Lives with: lives alone Lives in: House/apartment Stairs: No Has following equipment at home: Single point cane, Environmental Consultant - 2 wheeled, and Grab bars  PLOF: Independent Drives in town distances   PATIENT GOALS: Wants to go back to her exercise class   OBJECTIVE:  Note: Objective measures were completed at Evaluation unless otherwise noted.   COGNITION: Overall cognitive status: Within functional limits for tasks assessed   SENSATION: WFL  POSTURE: rounded shoulders   LOWER EXTREMITY MMT:    MMT Right Eval Left Eval  Hip flexion 5 4+  Hip extension    Hip abduction 5 5  Hip adduction 5 5  Hip internal rotation    Hip external rotation    Knee flexion 5 5  Knee extension 4+ 5  Ankle dorsiflexion 5 5  Ankle plantarflexion    Ankle inversion    Ankle eversion    (Blank rows = not tested)  BED MOBILITY:  Pt reports no issues   TRANSFERS: Sit to stand: Modified independence  Assistive device utilized: None     Stand to sit: Modified independence  Assistive device utilized: None      GAIT: Findings: Gait Characteristics: step through pattern, decreased stride length, and trunk flexed, Distance walked: Clinic distances , Assistive device utilized:Single point cane and None, Level of assistance: Modified independence and SBA, and Comments: Mod I when using SPC (pt holding it up at times)   FUNCTIONAL TESTS:  5  times sit to stand: 16.4 seconds with no UE support  10 meter walk test: 12.1 seconds with no AD = 2.71 ft/sec                                                                                                                                TREATMENT DATE: 02/28/24  Therapeutic Activity:  Discussed POC going forwards - will add 2 more visits, pt only able to come 1x a month due to distance from clinic  Discussed going back to community exercise classes as pt has done this in the past and really enjoyed it, discussed can take seated rest breaks as needed to gradually build up endurance, pt plans on doing this  Gait speed: 11.6 seconds = 2.83 ft/sec with no AD    [facility] PT Assessment - 02/28/24 0809       Functional Gait  Assessment   Gait assessed  Yes    Gait Level Surface Walks 20 ft, slow speed, abnormal gait pattern, evidence for imbalance or deviates 10-15 in outside of the 12 in walkway width. Requires more than 7 sec to ambulate 20 ft.   7.6 seconds   Change in Gait Speed Able to smoothly change walking speed without loss of balance or gait deviation. Deviate no more than 6 in outside of the 12 in walkway width.    Gait with Horizontal Head Turns Performs head turns  smoothly with slight change in gait velocity (eg, minor disruption to smooth gait path), deviates 6-10 in outside 12 in walkway width, or uses an assistive device.    Gait with Vertical Head Turns Performs head turns with no change in gait. Deviates no more than 6 in outside 12 in walkway width.    Gait and Pivot Turn Pivot turns safely within 3 sec and stops quickly with no loss of balance.    Step Over Obstacle Cannot perform without assistance.   needs HHA from therapist   Gait with Narrow Base of Support Ambulates less than 4 steps heel to toe or cannot perform without assistance.    Gait with Eyes Closed Walks 20 ft, slow speed, abnormal gait pattern, evidence for imbalance, deviates 10-15 in outside 12 in walkway width.  Requires more than 9 sec to ambulate 20 ft.   19 seconds   Ambulating Backwards Walks 20 ft, slow speed, abnormal gait pattern, evidence for imbalance, deviates 10-15 in outside 12 in walkway width.   19.8 seconds   Steps Alternating feet, must use rail.    Total Score 16    FGA comment: 16/30 = High Fall Risk         NMR:  With 4 smaller obstacles next to countertop: stepping over with step to pattern, leading with each leg 5 reps each side, pt more challenged with SLS on LLE, needing intermittent UE support on countertop  On air ex at bottom of staircase: Alternating SLS taps to first step 2 x 10 reps, then to 2nd step 10 reps each leg, CGA as needed for balance    PATIENT EDUCATION: Education details: Continue HEP, results of goals, see therapeutic activity section  Person educated: Patient and Child(ren) Education method: Explanation, Demonstration, Verbal cues, and Handouts Education comprehension: verbalized understanding and returned demonstration  HOME EXERCISE PROGRAM: Access Code: [facility] URL: [email or web address] Date: 01/31/2024 Prepared by: Sheffield Senate  Exercises - Sit to Stand  - 2 x daily - 7 x weekly - 1-2 sets - 10 reps - Standing Hip Abduction with Counter Support  - 1-2 x daily - 5 x weekly - 2 sets - 10 reps - Standing Single Leg Stance with Counter Support  - 1-2 x daily - 5 x weekly - 3 sets - 10-15 hold - Standing Marching  - 1-2 x daily - 5 x weekly - 3 sets - Tandem Walking with Counter Support  - 1-2 x daily - 5 x weekly - 3 sets - Wide Stance with Eyes Closed on Foam Pad  - 1-2 x daily - 5 x weekly - 3 sets - 30 hold - Standing with Head Rotation  - 1-2 x daily - 5 x weekly - 2 sets - 10 reps - performed on air ex with head turns and head nods    GOALS: Goals reviewed with patient? Yes  SHORT TERM GOALS: Target date: 01/31/2024  Pt will improve 5x sit<>stand to less than or equal to 13 sec to demonstrate improved functional  strength and transfer efficiency.  Baseline: 16.4 seconds with no UE support   9.3 seconds with no UE support (10/13) Goal status: MET   LONG TERM GOALS: Target date: 02/28/2024 UPDATED GOAL DATE FOR 12 WEEK POC: 04/02/24  Pt will improve FGA to at least a 18/30 in order to demo decr fall risk. Baseline: 14/30  16/30 (11/10) Goal status: ON-GOING  2.  Pt will improve gait speed with no AD vs. SPC to at least  2.9 ft/sec in order to demo improved community mobility.   Baseline: 12.1 seconds with no AD = 2.71 ft/sec   11.6 seconds = 2.83 ft/sec with no AD  Goal status: ON-GOING   ASSESSMENT:  CLINICAL IMPRESSION: Pt returns to PT session (only able to come 1x a month due to distance from clinic). Assessed LTGs with FGA and gait speed. Pt improved gait speed with no AD to 2.83 ft/sec (previously 2.71 ft/sec), indicating improved community mobility, but not quite to goal level. Pt scored a 16/30 on the FGA, indicating a continued high fall risk with pt with most difficulty with EC, retro gait, and obstacles (needing HHA from therapist). Pt to continue using her SPC in the community for balance. Discussed starting to go back to her community exercise classes at the [facility]. Will continue per POC.    OBJECTIVE IMPAIRMENTS: Abnormal gait, decreased activity tolerance, decreased balance, decreased strength, and postural dysfunction.   ACTIVITY LIMITATIONS: locomotion level  PARTICIPATION LIMITATIONS: community activity and going to silver sneakers classes   PERSONAL FACTORS: Age, Past/current experiences, Time since onset of injury/illness/exacerbation, 1-2 comorbidities: HTN, hypercalcemia , and distance from clinic - only able to come 1x a month  are also affecting patient's functional outcome.   REHAB POTENTIAL: Good  CLINICAL DECISION MAKING: Stable/uncomplicated  EVALUATION COMPLEXITY: Low  PLAN:  PT FREQUENCY: 1x/month - due to distance from clinic   PT DURATION: other: 3  months   PLANNED INTERVENTIONS: 97164- PT Re-evaluation, 97110-Therapeutic exercises, 97530- Therapeutic activity, 97112- Neuromuscular re-education, 97535- Self Care, [id number]- Manual therapy, [phone]- Gait training, Patient/Family education, Balance training, Stair training, and DME instructions  PLAN FOR NEXT SESSION: work on balance - SLS, head motions, EC, and functional strength, unlevel surfaces, try SciFit    Sheffield LOISE Senate, PT, DPT 02/28/2024, 11:55 AM

## 2024-02-28 NOTE — Progress Notes (Unsigned)
 Still living in [place] and has MD appointments in [place].  She is seeing dermatology and podiatry soon.    Elevated calcium .  She couldn't tolerate cinacalcet.  Recheck labs pending today.  Discussed getting follow-up bone density test.  She had gone to PT and that helped.  She is walking more, using a cane when outside.    Hypertension:    Using medication without problems or lightheadedness: yes Chest pain with exertion:no Edema:no Short of breath:no  Elevated Cholesterol: Using medications without problems: yes Muscle aches: no Diet compliance: d/w pt Exercise: d/w pt.    Back pain is better.   Insomnia.  Taking ambien at baseline.  No ADE on med. No parasomnias.    Covid 2025 Flu 2025 Tdap 2019 PNA prev done Shingles prev done- zostavax.   RSV d/w pt.  Colon screening deferred.  DXA prev done and wnl per old records.  Mammogram 2025.  Daughter Rico designated if patient were incapacitated.   Meds, vitals, and allergies reviewed.   ROS: Per HPI unless specifically indicated in ROS section   GEN: nad, alert and oriented HEENT: mucous membranes moist NECK: supple w/o LA CV: rrr PULM: ctab, no inc wob ABD: soft, +bs EXT: Trace L ankle edema.   SKIN: well perfused.

## 2024-02-28 NOTE — Patient Instructions (Addendum)
 Please call about the done density test.  [phone].  Go to the lab on the way out.   If you have mychart we'll likely use that to update you.    Take care.  Glad to see you.

## 2024-02-29 ENCOUNTER — Ambulatory Visit: Admitting: Podiatry

## 2024-02-29 ENCOUNTER — Encounter: Payer: Self-pay | Admitting: Podiatry

## 2024-02-29 DIAGNOSIS — M79675 Pain in left toe(s): Secondary | ICD-10-CM

## 2024-02-29 DIAGNOSIS — M79674 Pain in right toe(s): Secondary | ICD-10-CM | POA: Diagnosis not present

## 2024-02-29 DIAGNOSIS — B351 Tinea unguium: Secondary | ICD-10-CM | POA: Diagnosis not present

## 2024-02-29 LAB — COMPREHENSIVE METABOLIC PANEL WITH GFR
ALT: 15 U/L (ref 0–35)
AST: 20 U/L (ref 0–37)
Albumin: 4.4 g/dL (ref 3.5–5.2)
Alkaline Phosphatase: 85 U/L (ref 39–117)
BUN: 15 mg/dL (ref 6–23)
CO2: 27 meq/L (ref 19–32)
Calcium: 10.6 mg/dL — ABNORMAL HIGH (ref 8.4–10.5)
Chloride: 107 meq/L (ref 96–112)
Creatinine, Ser: 0.64 mg/dL (ref 0.40–1.20)
GFR: 74.52 mL/min (ref >60.00)
Glucose, Bld: 85 mg/dL (ref 70–99)
Potassium: 3.9 meq/L (ref 3.5–5.1)
Sodium: 141 meq/L (ref 135–145)
Total Bilirubin: 0.7 mg/dL (ref 0.2–1.2)
Total Protein: 6.8 g/dL (ref 6.0–8.3)

## 2024-02-29 LAB — PTH, INTACT AND CALCIUM
Calcium: 10.4 mg/dL (ref 8.6–10.4)
PTH: 34 pg/mL (ref 16–77)

## 2024-02-29 LAB — CBC WITH DIFFERENTIAL/PLATELET
Basophils Absolute: 0 K/uL (ref 0.0–0.1)
Basophils Relative: 0.7 % (ref 0.0–3.0)
Eosinophils Absolute: 0.1 K/uL (ref 0.0–0.7)
Eosinophils Relative: 2.9 % (ref 0.0–5.0)
HCT: 37.7 % (ref 36.0–46.0)
Hemoglobin: 12.9 g/dL (ref 12.0–15.0)
Lymphocytes Relative: 27.4 % (ref 12.0–46.0)
Lymphs Abs: 1.3 K/uL (ref 0.7–4.0)
MCHC: 34.3 g/dL (ref 30.0–36.0)
MCV: 94.9 fl (ref 78.0–100.0)
Monocytes Absolute: 0.6 K/uL (ref 0.1–1.0)
Monocytes Relative: 13.9 % — ABNORMAL HIGH (ref 3.0–12.0)
Neutro Abs: 2.5 K/uL (ref 1.4–7.7)
Neutrophils Relative %: 55.1 % (ref 43.0–77.0)
Platelets: 227 K/uL (ref 150.0–400.0)
RBC: 3.97 Mil/uL (ref 3.87–5.11)
RDW: 13.4 % (ref 11.5–15.5)
WBC: 4.6 K/uL (ref 4.0–10.5)

## 2024-02-29 LAB — LIPID PANEL
Cholesterol: 148 mg/dL (ref 0–200)
HDL: 65 mg/dL (ref >39.00)
LDL Cholesterol: 63 mg/dL (ref 0–99)
NonHDL: 82.86
Total CHOL/HDL Ratio: 2
Triglycerides: 101 mg/dL (ref 0.0–149.0)
VLDL: 20.2 mg/dL (ref 0.0–40.0)

## 2024-02-29 NOTE — Progress Notes (Signed)
 Subjective: Chief Complaint  Patient presents with   RFC    Rm11 Routine foot care    89 year old female presents the office for above concerns.  No ulcerations or other concerns today.  Objective: AAO x3, NAD- presents with daughter  DP/PT pulses palpable bilaterally, CRT less than 3 seconds Nails are hypertrophic, dystrophic, brittle, discolored, elongated 10. No surrounding redness or drainage. Tenderness nails 1-5 bilaterally.  Minimal callus right distal hallux, no ulcers b/l. No pain with calf compression, swelling, warmth, erythema  Assessment: Symptomatic onychomycosis  Plan: -All treatment options discussed with the patient including all alternatives, risks, complications.  -Sharply debrided the nails x 10 with any complications or bleeding. -Daily foot inspection. -Patient encouraged to call the office with any questions, concerns, change in symptoms.   Return in about 3 months (around 05/31/2024).  Patricia Mclaughlin DPM

## 2024-03-01 ENCOUNTER — Ambulatory Visit (INDEPENDENT_AMBULATORY_CARE_PROVIDER_SITE_OTHER)
Admission: RE | Admit: 2024-03-01 | Discharge: 2024-03-01 | Disposition: A | Discharge status: Home / Self Care | Source: Ambulatory Visit | Attending: Family Medicine | Admitting: Family Medicine

## 2024-03-01 ENCOUNTER — Ambulatory Visit: Payer: Self-pay | Admitting: Family Medicine

## 2024-03-01 ENCOUNTER — Other Ambulatory Visit (HOSPITAL_COMMUNITY): Payer: Self-pay

## 2024-03-01 DIAGNOSIS — Z780 Asymptomatic menopausal state: Secondary | ICD-10-CM

## 2024-03-01 DIAGNOSIS — Z0000 Encounter for general adult medical examination without abnormal findings: Secondary | ICD-10-CM | POA: Insufficient documentation

## 2024-03-01 MED ORDER — AREXVY 120 MCG/0.5ML IM SUSR
INTRAMUSCULAR | 0 refills | Status: DC
  Filled 2024-03-01: qty 0.5, 1d supply, fill #0

## 2024-03-01 NOTE — Assessment & Plan Note (Signed)
 Taking ambien at baseline.  No ADE on med. No parasomnias.   Would continue as is.

## 2024-03-01 NOTE — Assessment & Plan Note (Signed)
 Daughter Rico designated if patient were incapacitated.

## 2024-03-01 NOTE — Assessment & Plan Note (Signed)
Continue atorvastatin.  See notes on labs. 

## 2024-03-01 NOTE — Assessment & Plan Note (Signed)
 Covid 2025 Flu 2025 Tdap 2019 PNA prev done Shingles prev done- zostavax.   RSV d/w pt.  Colon screening deferred.  DXA prev done and wnl per old records.  Mammogram 2025.  Daughter Rico designated if patient were incapacitated.

## 2024-03-01 NOTE — Assessment & Plan Note (Signed)
 She couldn't tolerate cinacalcet.  Recheck labs pending today.  Discussed getting follow-up bone density test.

## 2024-03-01 NOTE — Assessment & Plan Note (Signed)
 Continue amlodipine  2.5 mg +5 mg to equal 7.5 mg/day.  Continue bisoprolol.  See notes on labs.

## 2024-03-05 ENCOUNTER — Encounter: Payer: Self-pay | Admitting: Family Medicine

## 2024-03-05 DIAGNOSIS — M810 Age-related osteoporosis without current pathological fracture: Secondary | ICD-10-CM | POA: Insufficient documentation

## 2024-03-21 ENCOUNTER — Ambulatory Visit: Payer: Self-pay | Attending: Family Medicine | Admitting: Physical Therapy

## 2024-03-27 ENCOUNTER — Ambulatory Visit: Admitting: Physical Therapy

## 2024-04-04 ENCOUNTER — Other Ambulatory Visit: Payer: Self-pay | Admitting: Family Medicine

## 2024-04-05 NOTE — Telephone Encounter (Signed)
 Bisoprolol Last filled:  12/28/23 Last OV:  02/28/24, annual exam Next OV:  none

## 2024-04-10 ENCOUNTER — Ambulatory Visit: Payer: Self-pay | Admitting: Physical Therapy

## 2024-05-01 ENCOUNTER — Encounter: Payer: Self-pay | Admitting: Family Medicine

## 2024-05-01 ENCOUNTER — Ambulatory Visit (INDEPENDENT_AMBULATORY_CARE_PROVIDER_SITE_OTHER): Admitting: Family Medicine

## 2024-05-01 MED ORDER — ALENDRONATE SODIUM 70 MG PO TABS: 70.0000 mg | ORAL_TABLET | ORAL | 3 refills | Status: AC

## 2024-05-01 NOTE — Patient Instructions (Addendum)
 Try fosamax  weekly and let me know if you can't tolerate that.  Update me as needed.  Take care.  Glad to see you.  Recheck in about 3-4 months with labs at the visit.  You don't need to fast.

## 2024-05-01 NOTE — Progress Notes (Unsigned)
 Osteoporosis d/w pt.  Path/phys d/w pt. history of hyperparathyroidism.  Discussed.  She did not tolerate Cinacalcet and is not enthused about surgery.  Bisphosphonate would be a reasonable third option.  DXA Results:   Lumbar spine L1-L4 Femoral neck (FN) T-score   - 0.5 RFN: -1.9 LFN: -2.5  Discussed bisphosphonate use.  Discussed dental concerns, atypical long bone fractures, renal function, vitamin D  level and esophagitis cautions.  She is in PT and helped her gait.   She isn't lightheaded.    Meds, vitals, and allergies reviewed.   ROS: Per HPI unless specifically indicated in ROS section   Nad Ncat Neck supple no LA RRR Ctab No BLE edema.   35 minutes were devoted to patient care in this encounter (this includes time spent reviewing the patient's file/history, interviewing and examining the patient, counseling/reviewing plan with patient).

## 2024-05-03 NOTE — Assessment & Plan Note (Signed)
 With history of hyperparathyroidism.  Taking bisphosphonates will not address the underlying cause but it may be the most reasonable option to try to decrease her fracture risk over the next few years.  See above regarding routine bisphosphonate cautions.  Discussed taking it on nifty stomach with adequate water.  Discussed staying upright after use.  Okay to start alendronate  weekly and update me as needed.  Recheck periodically.

## 2024-05-04 ENCOUNTER — Other Ambulatory Visit: Payer: Self-pay | Admitting: Family Medicine

## 2024-05-04 NOTE — Telephone Encounter (Signed)
 Refill request for  zolpidem 5 mg tablet   LOV - 05/01/24 Next OV - 08/07/24 Last refill - 12/06/23 #30/1

## 2024-05-17 ENCOUNTER — Encounter: Payer: Self-pay | Admitting: Family Medicine

## 2024-06-05 ENCOUNTER — Ambulatory Visit: Admitting: Podiatry

## 2024-07-11 ENCOUNTER — Ambulatory Visit: Admitting: Podiatry

## 2024-08-07 ENCOUNTER — Ambulatory Visit: Admitting: Family Medicine

## 2025-01-09 ENCOUNTER — Ambulatory Visit
